# Patient Record
Sex: Male | Born: 1960 | Race: Black or African American | Hispanic: No | Marital: Single | Smoking: Never smoker
Health system: Southern US, Community
[De-identification: ages and names within clinical notes are randomized; demographics above are authoritative.]

## PROBLEM LIST (undated history)

## (undated) DIAGNOSIS — N189 Chronic kidney disease, unspecified: Secondary | ICD-10-CM

## (undated) DIAGNOSIS — F209 Schizophrenia, unspecified: Secondary | ICD-10-CM

## (undated) DIAGNOSIS — K219 Gastro-esophageal reflux disease without esophagitis: Secondary | ICD-10-CM

## (undated) DIAGNOSIS — S0990XA Unspecified injury of head, initial encounter: Secondary | ICD-10-CM

## (undated) HISTORY — PX: OTHER SURGICAL HISTORY: SHX169

## (undated) HISTORY — DX: Chronic kidney disease, unspecified: N18.9

---

## 2008-11-01 ENCOUNTER — Ambulatory Visit: Payer: Self-pay | Admitting: Internal Medicine

## 2013-11-23 DIAGNOSIS — N289 Disorder of kidney and ureter, unspecified: Secondary | ICD-10-CM | POA: Insufficient documentation

## 2013-11-23 DIAGNOSIS — F200 Paranoid schizophrenia: Secondary | ICD-10-CM | POA: Insufficient documentation

## 2013-11-23 DIAGNOSIS — S0990XA Unspecified injury of head, initial encounter: Secondary | ICD-10-CM | POA: Insufficient documentation

## 2013-11-23 DIAGNOSIS — F209 Schizophrenia, unspecified: Secondary | ICD-10-CM

## 2013-12-07 DIAGNOSIS — N182 Chronic kidney disease, stage 2 (mild): Secondary | ICD-10-CM | POA: Insufficient documentation

## 2013-12-07 DIAGNOSIS — N189 Chronic kidney disease, unspecified: Secondary | ICD-10-CM | POA: Insufficient documentation

## 2013-12-07 DIAGNOSIS — N289 Disorder of kidney and ureter, unspecified: Secondary | ICD-10-CM | POA: Insufficient documentation

## 2014-06-02 LAB — HM COLONOSCOPY

## 2014-06-07 DIAGNOSIS — K219 Gastro-esophageal reflux disease without esophagitis: Secondary | ICD-10-CM | POA: Insufficient documentation

## 2014-06-07 DIAGNOSIS — J309 Allergic rhinitis, unspecified: Secondary | ICD-10-CM | POA: Insufficient documentation

## 2015-01-19 DIAGNOSIS — F329 Major depressive disorder, single episode, unspecified: Secondary | ICD-10-CM | POA: Insufficient documentation

## 2015-01-19 DIAGNOSIS — F32A Depression, unspecified: Secondary | ICD-10-CM | POA: Insufficient documentation

## 2015-05-04 ENCOUNTER — Ambulatory Visit: Payer: Medicare PPO

## 2015-05-04 ENCOUNTER — Ambulatory Visit
Admission: EM | Admit: 2015-05-04 | Discharge: 2015-05-04 | Disposition: A | Discharge status: Home / Self Care | Payer: Medicare PPO | Attending: Internal Medicine | Admitting: Internal Medicine

## 2015-05-04 DIAGNOSIS — R05 Cough: Secondary | ICD-10-CM | POA: Diagnosis present

## 2015-05-04 DIAGNOSIS — Z87891 Personal history of nicotine dependence: Secondary | ICD-10-CM | POA: Insufficient documentation

## 2015-05-04 DIAGNOSIS — J018 Other acute sinusitis: Secondary | ICD-10-CM

## 2015-05-04 DIAGNOSIS — F209 Schizophrenia, unspecified: Secondary | ICD-10-CM | POA: Insufficient documentation

## 2015-05-04 DIAGNOSIS — J019 Acute sinusitis, unspecified: Secondary | ICD-10-CM | POA: Diagnosis not present

## 2015-05-04 DIAGNOSIS — J40 Bronchitis, not specified as acute or chronic: Secondary | ICD-10-CM | POA: Diagnosis not present

## 2015-05-04 DIAGNOSIS — K219 Gastro-esophageal reflux disease without esophagitis: Secondary | ICD-10-CM | POA: Insufficient documentation

## 2015-05-04 HISTORY — DX: Schizophrenia, unspecified: F20.9

## 2015-05-04 HISTORY — DX: Unspecified injury of head, initial encounter: S09.90XA

## 2015-05-04 HISTORY — DX: Gastro-esophageal reflux disease without esophagitis: K21.9

## 2015-05-04 MED ORDER — PREDNISONE 50 MG PO TABS: 50.0000 mg | ORAL_TABLET | Freq: Every day | ORAL | Status: DC

## 2015-05-04 MED ORDER — AZITHROMYCIN 250 MG PO TABS: 250.0000 mg | ORAL_TABLET | Freq: Every day | ORAL | Status: DC

## 2015-05-04 NOTE — ED Notes (Signed)
Started around July 1st with cough and runny nose. Denies fever. States occasionally coughs yellow sputum. Brought in by Mom. Hx of schizophrenia

## 2015-05-04 NOTE — Discharge Instructions (Signed)
Prescriptions for zithromax and prednisone were sent to [facility] in [facility]. Recheck or followup with primary care provider if not improving in a few days, or for new fever >100.5, increasing phlegm production.
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
## 2015-10-13 IMAGING — CR DG CHEST 2V
2 series · 2 of 2 positions shown · non-contrast
Comparison: None.

CLINICAL DATA: Dry cough for the past 3 weeks.  Ex-smoker.

EXAM:
CHEST  2 VIEW

[chest pa]
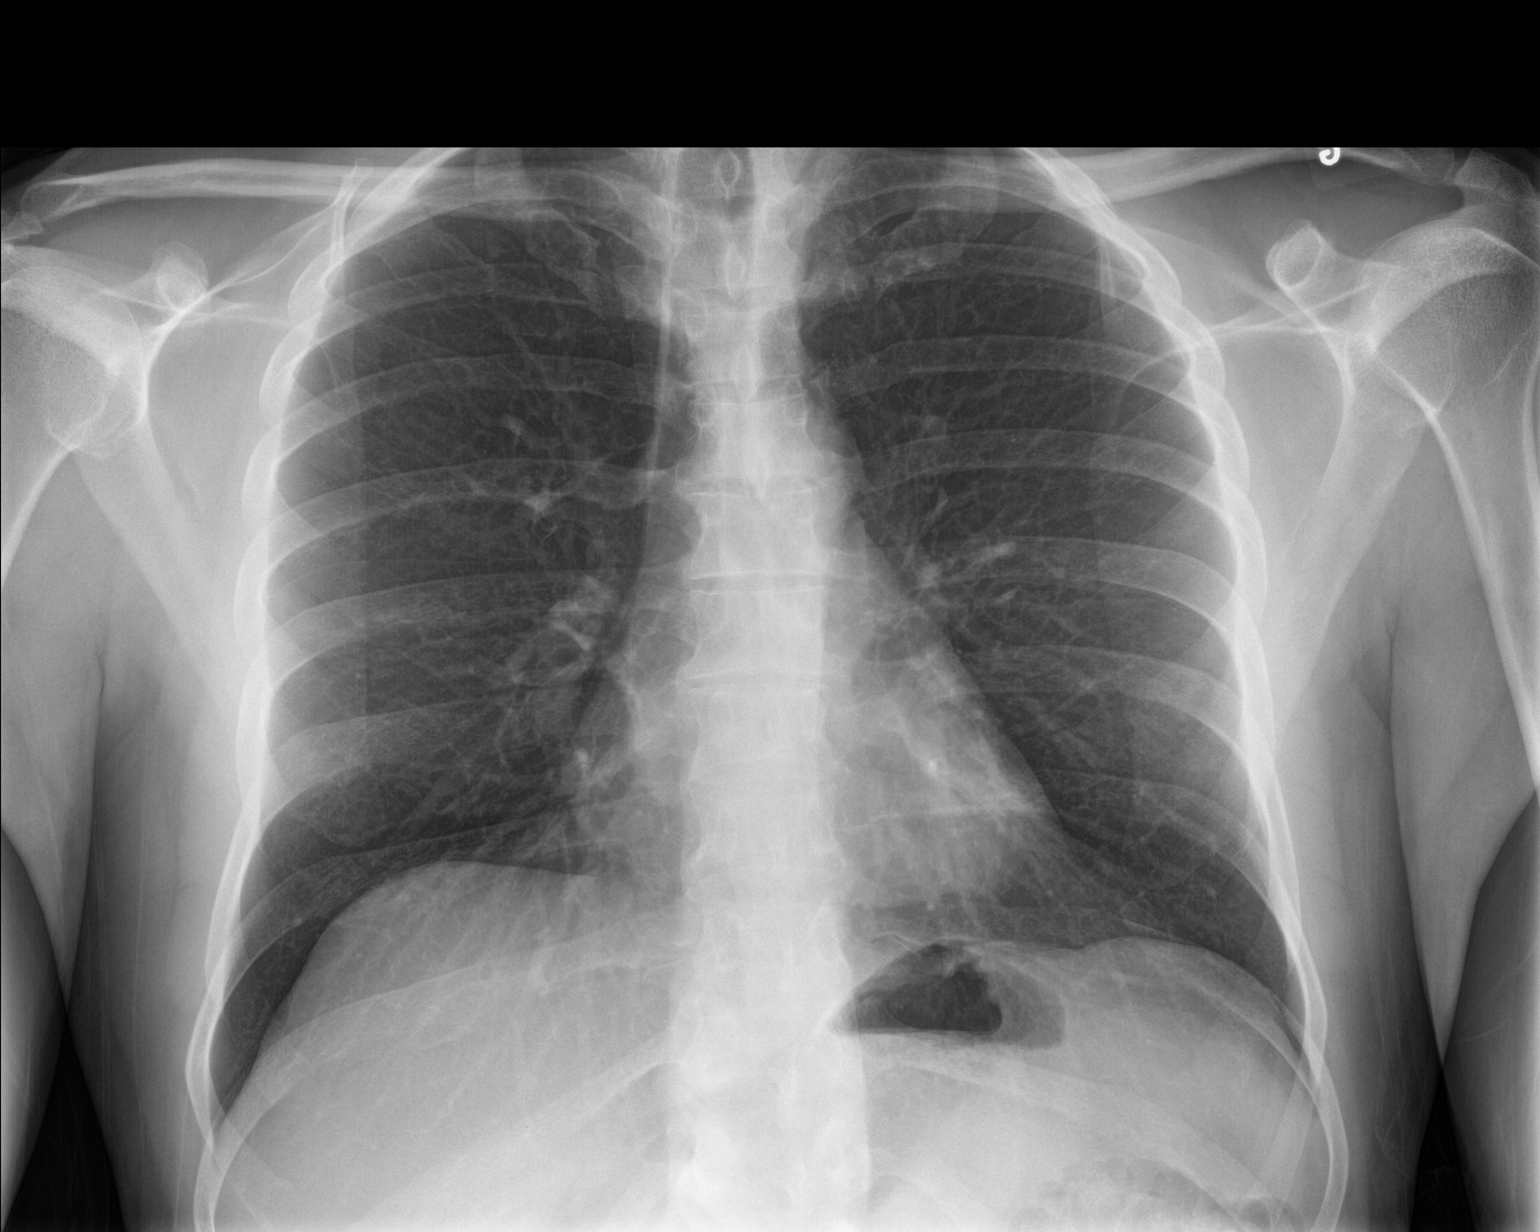

[chest lat]
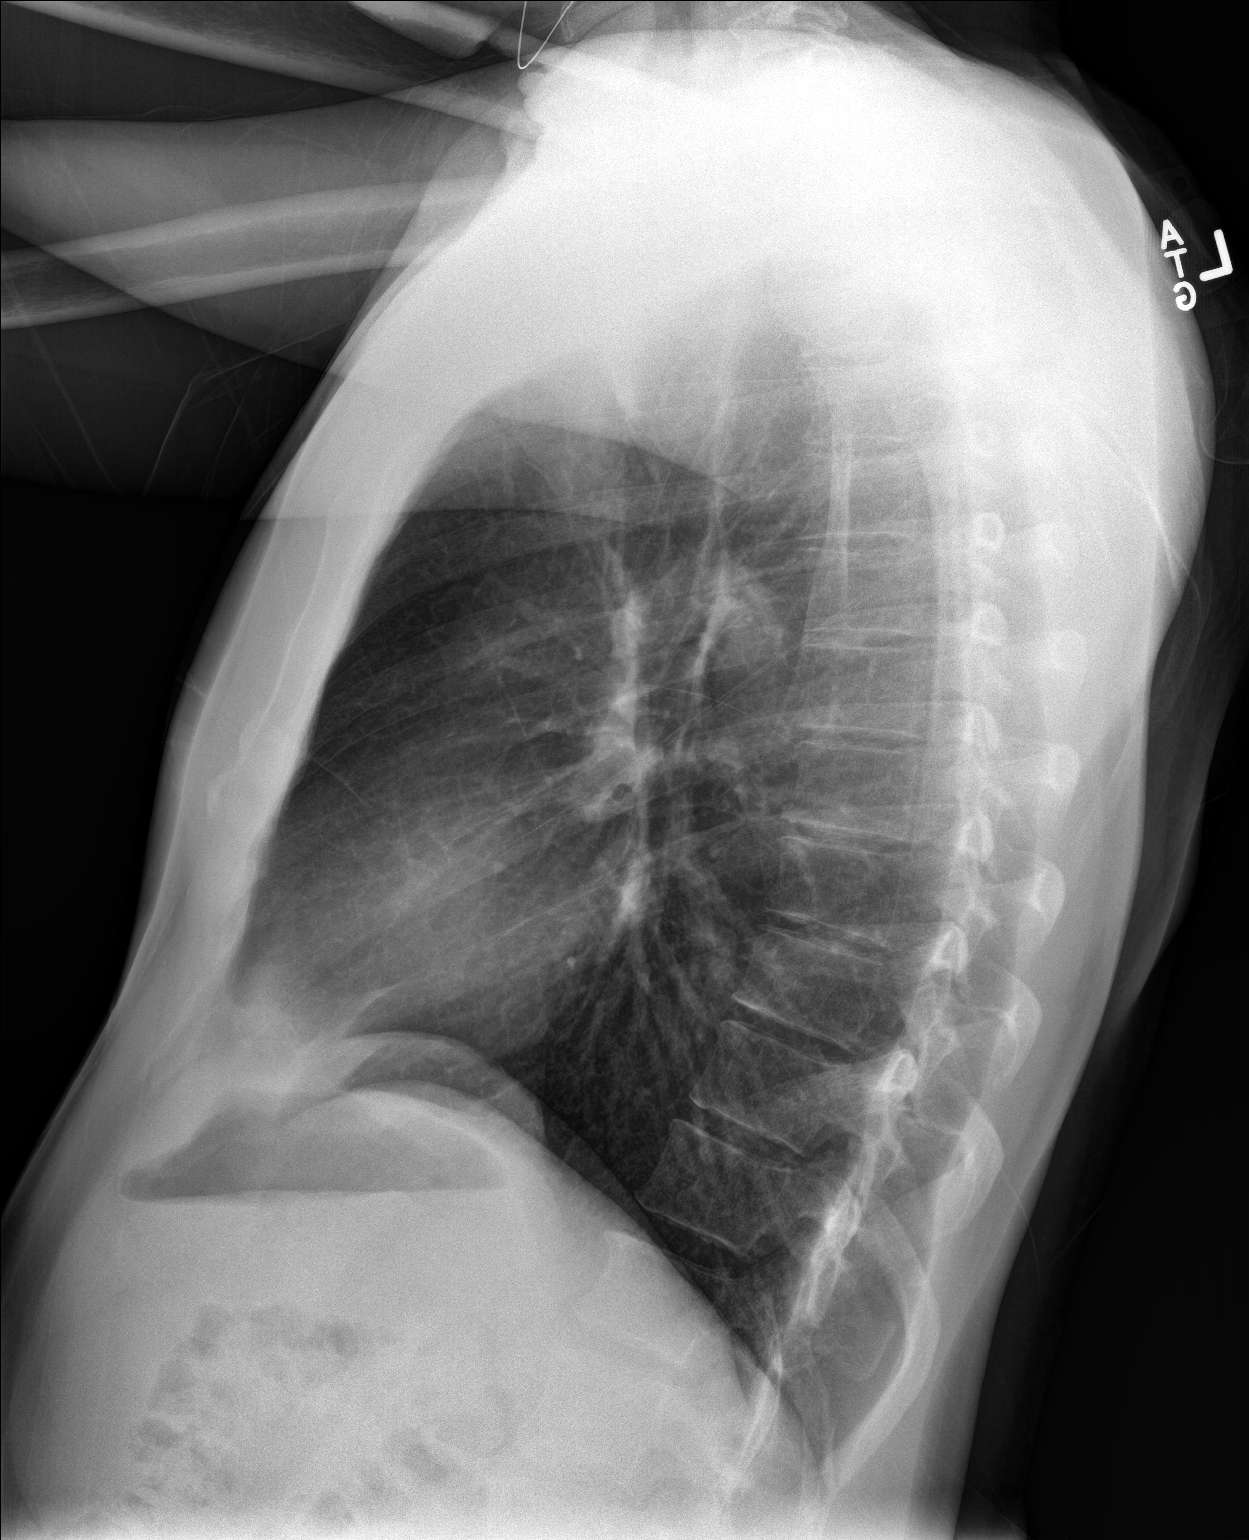

[2 of 2 positions shown; findings below may reference images not displayed]

FINDINGS: Normal sized heart. Clear lungs. Minimal thoracic spine degenerative
changes.
IMPRESSION: No acute abnormality.

## 2016-08-23 DIAGNOSIS — D696 Thrombocytopenia, unspecified: Secondary | ICD-10-CM | POA: Insufficient documentation

## 2016-08-23 DIAGNOSIS — R69 Illness, unspecified: Secondary | ICD-10-CM | POA: Insufficient documentation

## 2017-07-31 ENCOUNTER — Encounter: Payer: Self-pay | Admitting: *Deleted

## 2017-07-31 ENCOUNTER — Ambulatory Visit
Admission: EM | Admit: 2017-07-31 | Discharge: 2017-07-31 | Disposition: A | Discharge status: Home / Self Care | Payer: Medicare Other | Attending: Family Medicine | Admitting: Family Medicine

## 2017-07-31 DIAGNOSIS — M25522 Pain in left elbow: Secondary | ICD-10-CM

## 2017-07-31 DIAGNOSIS — M25512 Pain in left shoulder: Secondary | ICD-10-CM

## 2017-07-31 MED ORDER — PREDNISONE 10 MG (21) PO TBPK: ORAL_TABLET | Freq: Every day | ORAL | 0 refills | Status: DC

## 2017-07-31 NOTE — ED Triage Notes (Signed)
Patient started having left shoulder pain and left arm pain 2 weeks ago when he ran into a door jam.
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
## 2017-07-31 NOTE — Discharge Instructions (Signed)
Prednisone as prescribed.  Follow up with your primary.  Take care  Dr. Adriana Simasook

## 2017-08-18 ENCOUNTER — Ambulatory Visit (INDEPENDENT_AMBULATORY_CARE_PROVIDER_SITE_OTHER): Payer: Medicare Other | Admitting: Psychiatry

## 2017-08-18 ENCOUNTER — Encounter: Payer: Self-pay | Admitting: Psychiatry

## 2017-08-18 ENCOUNTER — Other Ambulatory Visit: Payer: Self-pay

## 2017-08-18 VITALS — BP 124/77 | HR 83 | Temp 97.5°F | Wt 158.6 lb

## 2017-08-18 DIAGNOSIS — F200 Paranoid schizophrenia: Secondary | ICD-10-CM

## 2017-08-18 MED ORDER — ARIPIPRAZOLE 30 MG PO TABS: 30.0000 mg | ORAL_TABLET | Freq: Every day | ORAL | 2 refills | Status: DC

## 2017-08-18 NOTE — Patient Instructions (Signed)
PLEASE GET YOUR LABS ORDERED - TSH, LIPID PANEL, HBA1C, PROLACTIN  PLEASE GET EKG .
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
## 2017-10-20 ENCOUNTER — Other Ambulatory Visit: Payer: Self-pay

## 2017-10-20 ENCOUNTER — Ambulatory Visit (INDEPENDENT_AMBULATORY_CARE_PROVIDER_SITE_OTHER): Payer: Medicare Other | Admitting: Psychiatry

## 2017-10-20 ENCOUNTER — Encounter: Payer: Self-pay | Admitting: Psychiatry

## 2017-10-20 VITALS — BP 120/70 | HR 82 | Temp 98.7°F | Wt 159.2 lb

## 2017-10-20 DIAGNOSIS — F200 Paranoid schizophrenia: Secondary | ICD-10-CM

## 2017-10-20 MED ORDER — ARIPIPRAZOLE 30 MG PO TABS: 30.0000 mg | ORAL_TABLET | Freq: Every day | ORAL | 1 refills | Status: DC

## 2017-10-20 NOTE — Progress Notes (Signed)
BH MD OP Progress Note  10/20/2017 11:55 AM Randall Simon  MRN:  [id number]  Chief Complaint: ' I am ok."  Chief Complaint    Follow-up; Medication Refill     HPI: Randall Simon is a 57 year old African-American male, single, on SSD, lives with his mother in [place], has a history of schizophrenia, presented to the clinic Simon for a follow-up visit.  Randall Simon and his mother Randall Simon both participated in the evaluation Simon.  Randall Simon was seen initially in person and later on his mother also participated.  Randall Simon reports that he is doing well on his current medication Abilify.  He denies any perceptual disturbances or mood symptoms at this time.  He denies any suicidality or homicidality.  He reports sleep and appetite as okay.  He denies any side effects to the medications.  Aims screening done equals 0.  Per mother he is currently doing well.  He does not appear to be paranoid or labile at home.  He has been doing chores around the house.  And he is observed as sleeping well.  Per mother there is no concern for any substance abuse problems.   Visit Diagnosis:    ICD-10-CM   1. Paranoid schizophrenia (HCC) F20.0 ARIPiprazole (ABILIFY) 30 MG tablet    Past Psychiatric History: Schizophrenia diagnosed in 10.  Previous admission to [facility].  Used to follow-up with Randall Simon in the past.  Currently on Abilify.  Denies past history of suicide attempts.  Past Medical History:  Past Medical History:  Diagnosis Date  . Chronic kidney disease   . GERD (gastroesophageal reflux disease)   . Head injury   . Schizophrenia Randall Simon)     Past Surgical History:  Procedure Laterality Date  . head surgery      Family Psychiatric History: Mother attempted suicide in the past.  Reports she was going through marital stressors at that time.  Family History:  Family History  Problem Relation Age of Onset  . Suicidality Mother   . Heart attack Father     Social  History: Patient is single.  He graduated high school and did one year of art school.  He is currently on SSD.  He lives with his mother in [place].  He has a twin brother and 2 other siblings.  He reports he has a good relationship with them. Social History   Socioeconomic History  . Marital status: Single    Spouse name: None  . Number of children: 0  . Years of education: None  . Highest education level: None  Social Needs  . Financial resource strain: Not very hard  . Food insecurity - worry: Never true  . Food insecurity - inability: Never true  . Transportation needs - medical: No  . Transportation needs - non-medical: No  Occupational History  . Occupation: disabiled  Tobacco Use  . Smoking status: Former Games developer  . Smokeless tobacco: Never Used  Substance and Sexual Activity  . Alcohol use: No  . Drug use: No  . Sexual activity: Not Currently  Other Topics Concern  . None  Social History Narrative  . None    Allergies: No Known Allergies  Metabolic Disorder Labs: No results found for: HGBA1C, MPG No results found for: PROLACTIN No results found for: CHOL, TRIG, HDL, CHOLHDL, VLDL, LDLCALC No results found for: TSH  Therapeutic Level Labs: No results found for: LITHIUM No results found for: VALPROATE No components found for:  CBMZ  Current  Medications: Current Outpatient Medications  Medication Sig Dispense Refill  . ARIPiprazole (ABILIFY) 30 MG tablet Take 1 tablet (30 mg total) by mouth daily. 90 tablet 1  . famotidine (PEPCID) 40 MG tablet Take 40 mg by mouth daily.    Marland Kitchen. acetaminophen (TYLENOL) 500 MG tablet Take 500 mg by mouth.    Marland Kitchen. FLUCELVAX QUADRIVALENT 0.5 ML SUSY TO BE ADMINISTERED BY PHARMACIST FOR IMMUNIZATION  0  . predniSONE (STERAPRED UNI-PAK 21 TAB) 10 MG (21) TBPK tablet Take by mouth daily. 6 tablets on Day 1, then decrease by 1 tablet daily until gone. (Patient not taking: Reported on 10/20/2017) 21 tablet 0  . vitamin B-12 (CYANOCOBALAMIN) 1000  MCG tablet Take by mouth.     No current facility-administered medications for this visit.      Musculoskeletal: Strength & Muscle Tone: within normal limits Gait & Station: normal Patient leans: N/A  Psychiatric Specialty Exam: Review of Systems  Psychiatric/Behavioral: Negative for depression and suicidal ideas. The patient is not nervous/anxious and does not have insomnia.   All other systems reviewed and are negative.   Blood pressure 120/70, pulse 82, temperature 98.7 F (37.1 C), temperature source Oral, weight 159 lb 3.2 oz (72.2 kg).Body mass index is 22.84 kg/m.  General Appearance: Casual  Eye Contact:  Fair  Speech:  Clear and Coherent  Volume:  Normal  Mood:  Euthymic  Affect:  Constricted  Thought Process:  Goal Directed and Descriptions of Associations: Intact  Orientation:  Full (Time, Place, and Person)  Thought Content: Logical   Suicidal Thoughts:  No  Homicidal Thoughts:  No  Memory:  Immediate;   Fair Recent;   Fair Remote;   Fair  Judgement:  Fair  Insight:  Fair  Psychomotor Activity:  Normal  Concentration:  Concentration: Fair and Attention Span: Fair  Recall:  [employer]  Fund of Knowledge: Fair  Language: Fair  Akathisia:  No  Handed:  Right  AIMS (if indicated): 0  Assets:  Communication Skills Desire for Improvement Social Support  ADL's:  Intact  Cognition: WNL  Sleep:  Fair   Screenings:AIMS    Assessment and Plan: Randall HillDonald is a 57 year old African-American male who has a history of schizophrenia.  He presents Simon with his mother.  He continues to do well on his Abilify and is compliant on his medication.  Denies any side effects.  He denies suicidality or perceptual disturbances.  He continues to be a good candidate for outpatient treatment at this time.  Plan as noted below.  Plan  For schizophrenia Continue Abilify 30 mg p.o. daily Aims =0  Pending EKG for QTC monitoring.  Reviewed medical records from Dr.  Jenelle Simon.  Collateral  information was obtained from mother.  Follow-up in 3 months or sooner if needed.  More than 50 % of the time was spent for psychoeducation and supportive psychotherapy and care coordination.  This note was generated in part or whole with voice recognition software. Voice recognition is usually quite accurate but there are transcription errors that can and very often do occur. I apologize for any typographical errors that were not detected and corrected.       Jomarie LongsSaramma Morgin Halls, MD 10/20/2017, 11:55 AM

## 2017-10-27 ENCOUNTER — Telehealth: Payer: Self-pay | Admitting: Psychiatry

## 2017-10-27 NOTE — Telephone Encounter (Signed)
EKG as well as labs results received from Dr. Sharilyn SitesMark Heffington. EKG reviewed-QTC within normal limits-normal ECG.  Dated 08/28/2017. TSH-08/28/2017-within normal limits Prolactin-08/28/2017-4.6-within normal limits Lipid panel-08/28/2017-HDL 37 slightly low, LDL-120-within normal limits CBC with differential- WBC low at 3.8-platelet low at 132-appears to be chronic.

## 2018-01-12 ENCOUNTER — Ambulatory Visit
Admission: EM | Admit: 2018-01-12 | Discharge: 2018-01-12 | Disposition: A | Discharge status: Home / Self Care | Payer: Medicare Other | Attending: Family Medicine | Admitting: Family Medicine

## 2018-01-12 ENCOUNTER — Other Ambulatory Visit: Payer: Self-pay

## 2018-01-12 ENCOUNTER — Encounter: Payer: Self-pay | Admitting: Emergency Medicine

## 2018-01-12 DIAGNOSIS — R05 Cough: Secondary | ICD-10-CM

## 2018-01-12 DIAGNOSIS — R058 Other specified cough: Secondary | ICD-10-CM

## 2018-01-12 MED ORDER — PREDNISONE 20 MG PO TABS: 40.0000 mg | ORAL_TABLET | Freq: Every day | ORAL | 0 refills | Status: DC

## 2018-01-12 MED ORDER — BENZONATATE 100 MG PO CAPS: 100.0000 mg | ORAL_CAPSULE | Freq: Three times a day (TID) | ORAL | 0 refills | Status: DC | PRN

## 2018-01-12 MED ORDER — ALBUTEROL SULFATE HFA 108 (90 BASE) MCG/ACT IN AERS: 2.0000 | INHALATION_SPRAY | Freq: Four times a day (QID) | RESPIRATORY_TRACT | 0 refills | Status: DC | PRN

## 2018-01-12 MED ORDER — DOXYCYCLINE HYCLATE 100 MG PO CAPS: 100.0000 mg | ORAL_CAPSULE | Freq: Two times a day (BID) | ORAL | 0 refills | Status: DC

## 2018-01-12 NOTE — ED Provider Notes (Signed)
[facility] ____________________________________________  Time seen: Approximately 3:00 PM  I have reviewed the triage vital signs and the nursing notes.   HISTORY  Chief Complaint Cough   HPI Randall Simon is a 57 y.o. male presenting for evaluation of cough and congestion symptoms that is been present for the last 3 weeks.  Patient reports he has had a cough lingering, states maybe slightly improved but continues.  States also with accompanying nasal congestion and drainage.  Denies sinus pain or sinus pressure.  Denies sore throat.  Denies accompanying fevers.  States unrelieved with some over-the-counter medications given from his mom.  Denies hemoptysis, chest pain, shortness of breath, known fevers or extremity pain.  He has continue to remain active.  Denies known seasonal allergy issues.  States his mom is sick with similar complaints.  Denies other known sick contacts.  Reports otherwise feels well. Denies recent sickness. Denies recent antibiotic use.   Sharilyn SitesHeffington, Mark, MD: PCP   Past Medical History:  Diagnosis Date  . Chronic kidney disease   . GERD (gastroesophageal reflux disease)   . Head injury   . Schizophrenia [facility](HCC)     Patient Active Problem List   Diagnosis Date Noted  . Taking medication for chronic disease 08/23/2016  . Thrombocytopenia (HCC) 08/23/2016  . Depression 01/19/2015  . Allergic rhinitis 06/07/2014  . GERD (gastroesophageal reflux disease) 06/07/2014  . Renal insufficiency, mild 12/07/2013  . Head injury 11/23/2013  . Kidney disease 11/23/2013  . Schizophrenia (HCC) 11/23/2013    Past Surgical History:  Procedure Laterality Date  . head surgery       No current facility-administered medications for this encounter.   Current Outpatient Medications:  .  ARIPiprazole (ABILIFY) 30 MG tablet, Take 1 tablet (30 mg total) by mouth daily., Disp: 90 tablet, Rfl: 1 .  famotidine (PEPCID) 40 MG tablet, Take 40 mg by mouth  daily., Disp: , Rfl:  .  vitamin B-12 (CYANOCOBALAMIN) 1000 MCG tablet, Take by mouth., Disp: , Rfl:  .  albuterol (PROVENTIL HFA;VENTOLIN HFA) 108 (90 Base) MCG/ACT inhaler, Inhale 2 puffs into the lungs every 6 (six) hours as needed for wheezing., Disp: 1 Inhaler, Rfl: 0 .  benzonatate (TESSALON PERLES) 100 MG capsule, Take 1 capsule (100 mg total) by mouth 3 (three) times daily as needed for cough., Disp: 15 capsule, Rfl: 0 .  doxycycline (VIBRAMYCIN) 100 MG capsule, Take 1 capsule (100 mg total) by mouth 2 (two) times daily., Disp: 20 capsule, Rfl: 0 .  FLUCELVAX QUADRIVALENT 0.5 ML SUSY, TO BE ADMINISTERED BY PHARMACIST FOR IMMUNIZATION, Disp: , Rfl: 0 .  predniSONE (DELTASONE) 20 MG tablet, Take 2 tablets (40 mg total) by mouth daily., Disp: 10 tablet, Rfl: 0  Allergies Patient has no known allergies.  Family History  Problem Relation Age of Onset  . Suicidality Mother   . Heart attack Father     Social History Social History   Tobacco Use  . Smoking status: Former Games developermoker  . Smokeless tobacco: Never Used  Substance Use Topics  . Alcohol use: No  . Drug use: No    Review of Systems Constitutional: No fever/chills ENT: No sore throat. As above.  Cardiovascular: Denies chest pain. Respiratory: Denies shortness of breath. Gastrointestinal: No abdominal pain.  No nausea, no vomiting.  No diarrhea. Genitourinary: Negative for dysuria. Musculoskeletal: Negative for back pain. Skin: Negative for rash. ____________________________________________   PHYSICAL EXAM:  VITAL SIGNS: ED Triage Vitals  Enc Vitals Group  BP 01/12/18 1409 126/68     Pulse Rate 01/12/18 1409 85     Resp 01/12/18 1409 16     Temp 01/12/18 1409 98.7 F (37.1 C)     Temp Source 01/12/18 1409 Oral     SpO2 01/12/18 1409 100 %     Weight 01/12/18 1409 159 lb (72.1 kg)     Height 01/12/18 1409 5\' 10"  (1.778 m)     Head Circumference --      Peak Flow --      Pain Score 01/12/18 1408 0     Pain  Loc --      Pain Edu? --      Excl. in GC? --     Constitutional: Alert and oriented. Well appearing and in no acute distress. Eyes: Conjunctivae are normal.  Head: Atraumatic. No sinus tenderness to palpation. No swelling. No erythema.  Ears: no erythema, normal TMs bilaterally.   Nose:Nasal congestion  Mouth/Throat: Mucous membranes are moist. No pharyngeal erythema. No tonsillar swelling or exudate.  Neck: No stridor.  No cervical spine tenderness to palpation. Hematological/Lymphatic/Immunilogical: No cervical lymphadenopathy. Cardiovascular: Normal rate, regular rhythm. Grossly normal heart sounds.  Good peripheral circulation. Respiratory: Normal respiratory effort.  No retractions. No wheezes.  Mild scattered rhonchi.  Dry intermittent cough in room with bronchospasm noted. Good air movement.  Speaks in complete sentences. Musculoskeletal: Ambulatory with steady gait. No cervical, thoracic or lumbar tenderness to palpation. Neurologic:  Normal speech and language. No gait instability. Skin:  Skin appears warm, dry and intact. No rash noted. Psychiatric: Mood and affect are normal. Speech and behavior are normal.  ___________________________________________   LABS (all labs ordered are listed, but only abnormal results are displayed)  Labs Reviewed - No data to display   PROCEDURES Procedures    INITIAL IMPRESSION / ASSESSMENT AND PLAN / ED COURSE  Pertinent labs & imaging results that were available during my care of the patient were reviewed by me and considered in my medical decision making (see chart for details).  Well-appearing patient.  No acute distress.  Suspect recent viral upper respiratory infection versus allergic rhinitis initially symptoms.  Will treat with oral doxycycline, prednisone, Tessalon Perles and PRN albuterol.  Encourage rest, fluids, supportive care.Discussed indication, risks and benefits of medications with patient.  Discussed follow up with  Primary care physician this week. Discussed follow up and return parameters including no resolution or any worsening concerns. Patient verbalized understanding and agreed to plan.   ____________________________________________   FINAL CLINICAL IMPRESSION(S) / ED DIAGNOSES  Final diagnoses:  Cough present for greater than 3 weeks     ED Discharge Orders        Ordered    benzonatate (TESSALON PERLES) 100 MG capsule  3 times daily PRN     01/12/18 1451    doxycycline (VIBRAMYCIN) 100 MG capsule  2 times daily     01/12/18 1451    albuterol (PROVENTIL HFA;VENTOLIN HFA) 108 (90 Base) MCG/ACT inhaler  Every 6 hours PRN     01/12/18 1451    predniSONE (DELTASONE) 20 MG tablet  Daily     01/12/18 1451       Note: This dictation was prepared with Dragon dictation along with smaller phrase technology. Any transcriptional errors that result from this process are unintentional.         Renford Dills, NP 01/12/18 1527

## 2018-01-12 NOTE — ED Triage Notes (Signed)
Patient in today c/o 3 week history of cough. Patient denies fever.

## 2018-01-12 NOTE — Discharge Instructions (Addendum)
Take medication as prescribed. Rest. Drink plenty of fluids.  ° °Follow up with your primary care physician this week as needed. Return to Urgent care for new or worsening concerns.  ° °

## 2018-01-15 ENCOUNTER — Telehealth: Payer: Self-pay

## 2018-01-15 ENCOUNTER — Ambulatory Visit: Payer: Medicare Other | Admitting: Psychiatry

## 2018-01-15 NOTE — Telephone Encounter (Signed)
Please let patient know he was given 90 days of abilify with refill , which is good until he comes back.

## 2018-01-15 NOTE — Telephone Encounter (Signed)
pt mother called left message that they had to cancel appt because everyone is sick.  pt had appt for today but had to cancel .  pt was last seen on  10-20-17. pt mother asked if a refill can be called in until everyone feels better they will r/s appt.

## 2018-01-15 NOTE — Telephone Encounter (Signed)
Patient does have one remaining refill on abilify.

## 2018-01-29 ENCOUNTER — Other Ambulatory Visit: Payer: Self-pay

## 2018-01-29 ENCOUNTER — Ambulatory Visit (INDEPENDENT_AMBULATORY_CARE_PROVIDER_SITE_OTHER): Payer: Medicare Other | Admitting: Psychiatry

## 2018-01-29 ENCOUNTER — Encounter: Payer: Self-pay | Admitting: Psychiatry

## 2018-01-29 VITALS — BP 119/75 | HR 75 | Temp 98.3°F | Wt 157.6 lb

## 2018-01-29 DIAGNOSIS — F200 Paranoid schizophrenia: Secondary | ICD-10-CM | POA: Diagnosis not present

## 2018-01-29 NOTE — Progress Notes (Signed)
[facility] MD OP Progress Note  01/29/2018 3:58 PM Randall Simon  MRN:  [id number]  Chief Complaint: ' I am here for follow up.' Chief Complaint    Follow-up; Medication Refill     HPI: Randall Simon is a 57 year old African-American male, single, on SSD, lives with his mother in [place], has a history of schizophrenia, presented to the clinic today for a follow-up visit.  Patient today presented along with his mother Ms. Randall Simon.  Patient and his mother both participated in the evaluation today.  Patient reports he is currently tolerating the Abilify well.  He continues to be on 30 mg daily.  He denies any side effects.  He denies any recent changes in his mood.  He denies any sadness or anxiety symptoms.  He reports sleep and appetite is okay.  An abnormal involuntary movement scale was done today and he scored 0 on the same.  Discussed with patient and mother that if he continues to do well on this dose then his dose can be slowly tapered down to a lower dose in 6 months or so.  Patient has been on this dose since the past 7 years or so.   Visit Diagnosis:    ICD-10-CM   1. Paranoid schizophrenia (HCC) F20.0     Past Psychiatric History: I have reviewed past psychiatric history from my progress note on 10/20/2017  Past Medical History:  Past Medical History:  Diagnosis Date  . Chronic kidney disease   . GERD (gastroesophageal reflux disease)   . Head injury   . Schizophrenia [facility](HCC)     Past Surgical History:  Procedure Laterality Date  . head surgery      Family Psychiatric History: I have reviewed family psychiatric history from my progress note on 10/20/2017.  Family History:  Family History  Problem Relation Age of Onset  . Suicidality Mother   . Heart attack Father     Social History: Patient is single.  He graduated high school.  He did one year of art school.  He is currently on SSD.  He lives with his mother in [place].  He has a twin brother and 2 other siblings.  He  reports he has a good relationship with them. Social History   Socioeconomic History  . Marital status: Single    Spouse name: Not on file  . Number of children: 0  . Years of education: Not on file  . Highest education level: Not on file  Occupational History  . Occupation: disabiled  Social Needs  . Financial resource strain: Not very hard  . Food insecurity:    Worry: Never true    Inability: Never true  . Transportation needs:    Medical: No    Non-medical: No  Tobacco Use  . Smoking status: Former Games developermoker  . Smokeless tobacco: Never Used  Substance and Sexual Activity  . Alcohol use: No  . Drug use: No  . Sexual activity: Not Currently  Lifestyle  . Physical activity:    Days per week: Not on file    Minutes per session: Not on file  . Stress: Not at all  Relationships  . Social connections:    Talks on phone: Once a week    Gets together: Once a week    Attends religious service: Patient refused    Active member of club or organization: No    Attends meetings of clubs or organizations: Never    Relationship status: Never married  Other Topics  Concern  . Not on file  Social History Narrative  . Not on file    Allergies: No Known Allergies  Metabolic Disorder Labs: No results found for: HGBA1C, MPG No results found for: PROLACTIN No results found for: CHOL, TRIG, HDL, CHOLHDL, VLDL, LDLCALC No results found for: TSH  Therapeutic Level Labs: No results found for: LITHIUM No results found for: VALPROATE No components found for:  CBMZ  Current Medications: Current Outpatient Medications  Medication Sig Dispense Refill  . albuterol (PROVENTIL HFA;VENTOLIN HFA) 108 (90 Base) MCG/ACT inhaler Inhale 2 puffs into the lungs every 6 (six) hours as needed for wheezing. 1 Inhaler 0  . ARIPiprazole (ABILIFY) 30 MG tablet Take 1 tablet (30 mg total) by mouth daily. 90 tablet 1  . benzonatate (TESSALON PERLES) 100 MG capsule Take 1 capsule (100 mg total) by mouth 3  (three) times daily as needed for cough. 15 capsule 0  . doxycycline (VIBRAMYCIN) 100 MG capsule Take 1 capsule (100 mg total) by mouth 2 (two) times daily. 20 capsule 0  . famotidine (PEPCID) 40 MG tablet Take 40 mg by mouth daily.    Marland Kitchen FLUCELVAX QUADRIVALENT 0.5 ML SUSY TO BE ADMINISTERED BY PHARMACIST FOR IMMUNIZATION  0  . predniSONE (DELTASONE) 20 MG tablet Take 2 tablets (40 mg total) by mouth daily. 10 tablet 0  . vitamin B-12 (CYANOCOBALAMIN) 1000 MCG tablet Take by mouth.     No current facility-administered medications for this visit.      Musculoskeletal: Strength & Muscle Tone: within normal limits Gait & Station: normal Patient leans: N/A  Psychiatric Specialty Exam: Review of Systems  Psychiatric/Behavioral: Negative for hallucinations.  All other systems reviewed and are negative.   Blood pressure 119/75, pulse 75, temperature 98.3 F (36.8 C), temperature source Oral, weight 157 lb 9.6 oz (71.5 kg).Body mass index is 22.61 kg/m.  General Appearance: Casual  Eye Contact:  Fair  Speech:  Clear and Coherent  Volume:  Normal  Mood:  Euthymic  Affect:  Congruent  Thought Process:  Goal Directed and Descriptions of Associations: Intact  Orientation:  Full (Time, Place, and Person)  Thought Content: Logical   Suicidal Thoughts:  No  Homicidal Thoughts:  No  Memory:  Immediate;   Fair Recent;   Fair Remote;   Fair  Judgement:  Fair  Insight:  Fair  Psychomotor Activity:  Normal  Concentration:  Concentration: Fair and Attention Span: Fair  Recall:  [employer] of Knowledge: Fair  Language: Fair  Akathisia:  No  Handed:  Right  AIMS (if indicated): 0  Assets:  Communication Skills Desire for Improvement Housing Social Support  ADL's:  Intact  Cognition: WNL  Sleep:  Fair   Screenings: AIMS     [facility] Visit from [facility]/29/2018 in [facility]  AIMS Total Score  0    AIMS   Assessment and Plan: Randall Simon is a 57 year old  African-American male who has a history of schizophrenia.  He today presented along with his mother for the evaluation.  He and his mother both participated in the evaluation today.  He continues to do well.  He continues to be compliant on medication as prescribed.  Plan as noted below.  Plan For schizophrenia Continue Abilify 30 mg p.o. daily Aims equals 0  Discussed with patient as well as mother that we have received his most recent blood work as well as EKG from his previous primary medical doctor.  He does have a history of low  platelet count.  His PMD will continue to manage the same.  Follow-up in clinic in 5 months or sooner if needed.  More than 50 % of the time was spent for psychoeducation and supportive psychotherapy and care coordination.  This note was generated in part or whole with voice recognition software. Voice recognition is usually quite accurate but there are transcription errors that can and very often do occur. I apologize for any typographical errors that were not detected and corrected.       Jomarie Longs, MD 01/29/2018, 3:58 PM

## 2018-04-29 ENCOUNTER — Other Ambulatory Visit: Payer: Self-pay | Admitting: Child and Adolescent Psychiatry

## 2018-04-29 DIAGNOSIS — F200 Paranoid schizophrenia: Secondary | ICD-10-CM

## 2018-04-29 MED ORDER — ARIPIPRAZOLE 30 MG PO TABS: 30.0000 mg | ORAL_TABLET | Freq: Every day | ORAL | 0 refills | Status: DC

## 2018-04-29 NOTE — Progress Notes (Signed)
Covering physician note:  Refills requested by pt's mother over the phone. Chart was reviewed. Pt was last seen Dr. Elna BreslowEappen on 04/18 with plan to continue Abilify 30 mg daily and follow up in 5 months. Abilify 30 mg rx was last sent to [facility] on 10/20/2017 with 90 tabs x 1 refill. [facility] to [facility]. Front desk will let pt's mother know about this.

## 2018-06-23 ENCOUNTER — Encounter: Payer: Self-pay | Admitting: Psychiatry

## 2018-06-23 ENCOUNTER — Ambulatory Visit (INDEPENDENT_AMBULATORY_CARE_PROVIDER_SITE_OTHER): Payer: Medicare Other | Admitting: Psychiatry

## 2018-06-23 ENCOUNTER — Other Ambulatory Visit: Payer: Self-pay

## 2018-06-23 VITALS — BP 116/74 | HR 73 | Temp 97.7°F | Wt 160.6 lb

## 2018-06-23 DIAGNOSIS — F200 Paranoid schizophrenia: Secondary | ICD-10-CM

## 2018-06-23 MED ORDER — ARIPIPRAZOLE 30 MG PO TABS: 30.0000 mg | ORAL_TABLET | Freq: Every day | ORAL | 1 refills | Status: DC

## 2018-06-23 NOTE — Progress Notes (Signed)
[facility] MD OP Progress Note  06/23/2018 4:45 PM KENSHIN SPLAWN  MRN:  [id number]  Chief Complaint: ' I am here for follow up." Chief Complaint    Follow-up; Medication Refill     HPI: Randall Simon is a 57 year old African-American male, single, on SSD, lives with his mother in [place], has a history of schizophrenia, presented to the clinic today for a follow-up visit.  Patient today reports he is currently doing well.  He denies any mood symptoms.  He reports sleep is fair.  He continues to be compliant with his 30 mg of Abilify.  He denies any side effects.  He denies any stiffness, rigidity or tremors.  He continues to have good social support from his mother.  He denies any other concerns today. Visit Diagnosis:    ICD-10-CM   1. 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  Past Medical History:  Past Medical History:  Diagnosis Date  . Chronic kidney disease   . GERD (gastroesophageal reflux disease)   . Head injury   . Schizophrenia [facility])     Past Surgical History:  Procedure Laterality Date  . head surgery      Family Psychiatric History: Reviewed family psychiatric history from my progress note on 10/20/2017.  Family History:  Family History  Problem Relation Age of Onset  . Suicidality Mother   . Heart attack Father     Social History: Reviewed social history from my progress note on 10/20/2017 Social History   Socioeconomic History  . Marital status: Single    Spouse name: Not on file  . Number of children: 0  . Years of education: Not on file  . Highest education level: Not on file  Occupational History  . Occupation: disabiled  Social Needs  . Financial resource strain: Not very hard  . Food insecurity:    Worry: Never true    Inability: Never true  . Transportation needs:    Medical: No    Non-medical: No  Tobacco  Use  . Smoking status: Former Games developer  . Smokeless tobacco: Never Used  Substance and Sexual Activity  . Alcohol use: No  . Drug use: No  . Sexual activity: Not Currently  Lifestyle  . Physical activity:    Days per week: Not on file    Minutes per session: Not on file  . Stress: Not at all  Relationships  . Social connections:    Talks on phone: Once a week    Gets together: Once a week    Attends religious service: Patient refused    Active member of club or organization: No    Attends meetings of clubs or organizations: Never    Relationship status: Never married  Other Topics Concern  . Not on file  Social History Narrative  . Not on file    Allergies: No Known Allergies  Metabolic Disorder Labs: No results found for: HGBA1C, MPG No results found for: PROLACTIN No results found for: CHOL, TRIG, HDL, CHOLHDL, VLDL, LDLCALC No results found for: TSH  Therapeutic Level Labs: No results found for: LITHIUM No results found for: VALPROATE No components found for:  CBMZ  Current Medications: Current Outpatient Medications  Medication Sig Dispense Refill  . albuterol (PROVENTIL HFA;VENTOLIN HFA) 108 (90 Base) MCG/ACT inhaler Inhale 2 puffs into the lungs every 6 (six) hours as needed for wheezing. 1 Inhaler 0  .  ARIPiprazole (ABILIFY) 30 MG tablet Take 1 tablet (30 mg total) by mouth daily. 90 tablet 1  . benzonatate (TESSALON PERLES) 100 MG capsule Take 1 capsule (100 mg total) by mouth 3 (three) times daily as needed for cough. 15 capsule 0  . doxycycline (VIBRAMYCIN) 100 MG capsule Take 1 capsule (100 mg total) by mouth 2 (two) times daily. 20 capsule 0  . famotidine (PEPCID) 40 MG tablet Take 40 mg by mouth daily.    Marland Kitchen FLUCELVAX QUADRIVALENT 0.5 ML SUSY TO BE ADMINISTERED BY PHARMACIST FOR IMMUNIZATION  0  . predniSONE (DELTASONE) 20 MG tablet Take 2 tablets (40 mg total) by mouth daily. 10 tablet 0  . vitamin B-12 (CYANOCOBALAMIN) 1000 MCG tablet Take by mouth.      No current facility-administered medications for this visit.      Musculoskeletal: Strength & Muscle Tone: within normal limits Gait & Station: normal Patient leans: N/A  Psychiatric Specialty Exam: Review of Systems  Psychiatric/Behavioral: Negative for depression. The patient is not nervous/anxious.   All other systems reviewed and are negative.   Blood pressure 116/74, pulse 73, temperature 97.7 F (36.5 C), temperature source Oral, weight 160 lb 9.6 oz (72.8 kg).Body mass index is 23.04 kg/m.  General Appearance: Casual  Eye Contact:  Fair  Speech:  Normal Rate  Volume:  Normal  Mood:  Euthymic  Affect:  Congruent  Thought Process:  Goal Directed and Descriptions of Associations: Intact  Orientation:  Full (Time, Place, and Person)  Thought Content: Logical   Suicidal Thoughts:  No  Homicidal Thoughts:  No  Memory:  Immediate;   Fair Recent;   Fair Remote;   Fair  Judgement:  Fair  Insight:  Fair  Psychomotor Activity:  Normal  Concentration:  Concentration: Fair and Attention Span: Fair  Recall:  [employer] of Knowledge: Fair  Language: Fair  Akathisia:  No  Handed:  Right  AIMS (if indicated): 0  Assets:  Communication Skills Desire for Improvement Social Support  ADL's:  Intact  Cognition: WNL  Sleep:  Fair   Screenings: AIMS     Office Visit from 06/23/2018 in [facility] Office Visit from 01/29/2018 in [facility]  AIMS Total Score  0  0       Assessment and Plan: Randall Simon is a 57 year old African-American male who has a history of schizophrenia, presented to the clinic today with his mother.  He continues to do well on the current medication regimen.  Will continue plan as noted below.  Plan For schizophrenia in  remission Continue Abilify 30 mg p.o. daily. Discussed with patient as well as mother that if he continues to do well his dosage can be slowly tapered down during his next  visit.  Aims equals 0  Follow-up in clinic in 4-5 months or sooner if needed.  More than 50 % of the time was spent for psychoeducation and supportive psychotherapy and care coordination.  This note was generated in part or whole with voice recognition software. Voice recognition is usually quite accurate but there are transcription errors that can and very often do occur. I apologize for any typographical errors that were not detected and corrected.       Jomarie Longs, MD 06/23/2018, 4:45 PM

## 2018-11-16 ENCOUNTER — Other Ambulatory Visit: Payer: Self-pay | Admitting: Psychiatry

## 2018-11-16 DIAGNOSIS — F200 Paranoid schizophrenia: Secondary | ICD-10-CM

## 2018-12-01 ENCOUNTER — Other Ambulatory Visit: Payer: Self-pay

## 2018-12-01 ENCOUNTER — Ambulatory Visit (INDEPENDENT_AMBULATORY_CARE_PROVIDER_SITE_OTHER): Payer: Medicare Other | Admitting: Psychiatry

## 2018-12-01 ENCOUNTER — Encounter: Payer: Self-pay | Admitting: Psychiatry

## 2018-12-01 VITALS — BP 120/79 | HR 91 | Temp 97.4°F | Wt 161.4 lb

## 2018-12-01 DIAGNOSIS — F200 Paranoid schizophrenia: Secondary | ICD-10-CM

## 2018-12-01 MED ORDER — ARIPIPRAZOLE 5 MG PO TABS: 5.0000 mg | ORAL_TABLET | Freq: Every day | ORAL | 1 refills | Status: DC

## 2018-12-01 MED ORDER — ARIPIPRAZOLE 20 MG PO TABS: 20.0000 mg | ORAL_TABLET | Freq: Every day | ORAL | 1 refills | Status: DC

## 2018-12-01 NOTE — Progress Notes (Signed)
BH MD OP Progress Note  12/01/2018 4:57 PM Randall Simon  MRN:  [id number]  Chief Complaint: ' I am here for follow up." Chief Complaint    Follow-up     HPI: Randall Simon is a 58 yr old African-American male, single, lives in [place] with his mother, presented to the clinic today for a follow-up visit.  Patient has a history of paranoid schizophrenia.  Patient today presented along with his mother who provided collateral information.  Patient reports he continues to do well with regards to his mood symptoms.  He denies any sleep problems.  He reports appetite is fair.  He is compliant on his medications.  He denies side effects to the Abilify.  Discussed with patient since he has been stable on the Abilify for a long time that we can try reducing the dosage.  We will try to reduce the dosage to 25 mg.  Patient as well as mother will monitor his symptoms closely.  Discussed with patient as well as mother to call if he has any worsening mood symptoms after going on a reduced dosage.     Visit Diagnosis:    ICD-10-CM   1. Paranoid schizophrenia in remission (HCC) F20.0 ARIPiprazole (ABILIFY) 20 MG tablet    ARIPiprazole (ABILIFY) 5 MG tablet    Past Psychiatric History: I have reviewed past psychiatric history from my progress note on 10/20/2017.  Past Medical History:  Past Medical History:  Diagnosis Date  . Chronic kidney disease   . GERD (gastroesophageal reflux disease)   . Head injury   . Schizophrenia [facility])     Past Surgical History:  Procedure Laterality Date  . head surgery      Family Psychiatric History: I have reviewed family psychiatric history from my progress note on 10/20/2017.  Family History:  Family History  Problem Relation Age of Onset  . Suicidality Mother   . Heart attack Father     Social History: Reviewed social history from my progress note on 10/20/2017. Social History   Socioeconomic History  . Marital status: Single    Spouse name: Not on  file  . Number of children: 0  . Years of education: Not on file  . Highest education level: Not on file  Occupational History  . Occupation: disabiled  Social Needs  . Financial resource strain: Not very hard  . Food insecurity:    Worry: Never true    Inability: Never true  . Transportation needs:    Medical: No    Non-medical: No  Tobacco Use  . Smoking status: Former Games developer  . Smokeless tobacco: Never Used  Substance and Sexual Activity  . Alcohol use: No  . Drug use: No  . Sexual activity: Not Currently  Lifestyle  . Physical activity:    Days per week: Not on file    Minutes per session: Not on file  . Stress: Not at all  Relationships  . Social connections:    Talks on phone: Once a week    Gets together: Once a week    Attends religious service: Patient refused    Active member of club or organization: No    Attends meetings of clubs or organizations: Never    Relationship status: Never married  Other Topics Concern  . Not on file  Social History Narrative  . Not on file    Allergies: No Known Allergies  Metabolic Disorder Labs: No results found for: HGBA1C, MPG No results found for: PROLACTIN No  results found for: CHOL, TRIG, HDL, CHOLHDL, VLDL, LDLCALC No results found for: TSH  Therapeutic Level Labs: No results found for: LITHIUM No results found for: VALPROATE No components found for:  CBMZ  Current Medications: Current Outpatient Medications  Medication Sig Dispense Refill  . albuterol (PROVENTIL HFA;VENTOLIN HFA) 108 (90 Base) MCG/ACT inhaler Inhale 2 puffs into the lungs every 6 (six) hours as needed for wheezing. 1 Inhaler 0  . ARIPiprazole (ABILIFY) 20 MG tablet Take 1 tablet (20 mg total) by mouth daily. To be combined with 5 mg total 25 mg 30 tablet 1  . ARIPiprazole (ABILIFY) 5 MG tablet Take 1 tablet (5 mg total) by mouth daily. To be combined with 20 mg total 25 mg 30 tablet 1  . benzonatate (TESSALON PERLES) 100 MG capsule Take 1  capsule (100 mg total) by mouth 3 (three) times daily as needed for cough. 15 capsule 0  . doxycycline (VIBRAMYCIN) 100 MG capsule Take 1 capsule (100 mg total) by mouth 2 (two) times daily. 20 capsule 0  . famotidine (PEPCID) 40 MG tablet Take 40 mg by mouth daily.    Marland Kitchen. FLUCELVAX QUADRIVALENT 0.5 ML SUSY TO BE ADMINISTERED BY PHARMACIST FOR IMMUNIZATION  0  . predniSONE (DELTASONE) 20 MG tablet Take 2 tablets (40 mg total) by mouth daily. 10 tablet 0  . vitamin B-12 (CYANOCOBALAMIN) 1000 MCG tablet Take by mouth.     No current facility-administered medications for this visit.      Musculoskeletal: Strength & Muscle Tone: within normal limits Gait & Station: normal Patient leans: N/A  Psychiatric Specialty Exam: Review of Systems  Psychiatric/Behavioral: Negative for depression. The patient is not nervous/anxious.   All other systems reviewed and are negative.   Blood pressure 120/79, pulse 91, temperature (!) 97.4 F (36.3 C), temperature source Oral, weight 161 lb 6.4 oz (73.2 kg).Body mass index is 23.16 kg/m.  General Appearance: Casual  Eye Contact:  Fair  Speech:  Clear and Coherent  Volume:  Normal  Mood:  Euthymic  Affect:  Congruent  Thought Process:  Goal Directed and Descriptions of Associations: Intact  Orientation:  Full (Time, Place, and Person)  Thought Content: Logical   Suicidal Thoughts:  No  Homicidal Thoughts:  No  Memory:  Immediate;   Fair Recent;   Fair Remote;   Fair  Judgement:  Fair  Insight:  Fair  Psychomotor Activity:  Normal  Concentration:  Concentration: Fair and Attention Span: Fair  Recall:  [employer]  Fund of Knowledge: Fair  Language: Fair  Akathisia:  No  Handed:  Right  AIMS (if indicated): 0  Assets:  Communication Skills Desire for Improvement Social Support  ADL's:  Intact  Cognition: WNL  Sleep:  Fair   Screenings: AIMS     [facility]/23/2018 in [facility] Office Visit from 01/29/2018  in [facility]  AIMS Total Score  0  0       Assessment and Plan:Randall Simon is a 58 yr old African-American male who has a history of schizophrenia, presented to clinic today with his mother.  She continues to do well on the Abilify.  Will try to reduce the dosage since he has been doing well for so long.  Patient as well as mother agrees with plan.  Plan For schizophrenia in remission We will reduce Abilify to 25 mg p.o. daily. Discussed with patient as well as mother to monitor his symptoms closely and let writer know if he  has any worsening mood symptoms. Aims equals 0  Follow-up in clinic in 2 months or sooner if needed.  I have spent atleast 15 minutes  face to face with patient today. More than 50 % of the time was spent for psychoeducation and supportive psychotherapy and care coordination.  This note was generated in part or whole with voice recognition software. Voice recognition is usually quite accurate but there are transcription errors that can and very often do occur. I apologize for any typographical errors that were not detected and corrected.        Jomarie Longs, MD 12/01/2018, 4:57 PM

## 2019-01-12 ENCOUNTER — Telehealth: Payer: Self-pay

## 2019-01-12 DIAGNOSIS — F200 Paranoid schizophrenia: Secondary | ICD-10-CM

## 2019-01-12 MED ORDER — ARIPIPRAZOLE 20 MG PO TABS: 20.0000 mg | ORAL_TABLET | Freq: Every day | ORAL | 1 refills | Status: DC

## 2019-01-12 MED ORDER — ARIPIPRAZOLE 5 MG PO TABS: 5.0000 mg | ORAL_TABLET | Freq: Every day | ORAL | 1 refills | Status: DC

## 2019-01-12 NOTE — Telephone Encounter (Signed)
Sent meds to pharmacy at [facility]

## 2019-01-12 NOTE — Telephone Encounter (Signed)
pt needs a refill on his abilify needs a 10 day supply while mail order process rx. please send 10 day supply to [facility]

## 2019-01-26 ENCOUNTER — Other Ambulatory Visit: Payer: Self-pay | Admitting: Psychiatry

## 2019-01-26 ENCOUNTER — Telehealth: Payer: Self-pay

## 2019-01-26 DIAGNOSIS — F200 Paranoid schizophrenia: Secondary | ICD-10-CM

## 2019-01-26 NOTE — Telephone Encounter (Signed)
pt mother called states that patient insurance and pharmacy is requesting a 90 day supply of his medications. pt has appt on  01-28-19

## 2019-01-28 ENCOUNTER — Encounter: Payer: Self-pay | Admitting: Psychiatry

## 2019-01-28 ENCOUNTER — Other Ambulatory Visit: Payer: Self-pay

## 2019-01-28 ENCOUNTER — Ambulatory Visit (INDEPENDENT_AMBULATORY_CARE_PROVIDER_SITE_OTHER): Payer: Medicare Other | Admitting: Psychiatry

## 2019-01-28 DIAGNOSIS — F200 Paranoid schizophrenia: Secondary | ICD-10-CM | POA: Diagnosis not present

## 2019-01-28 MED ORDER — ARIPIPRAZOLE 20 MG PO TABS: 20.0000 mg | ORAL_TABLET | Freq: Every day | ORAL | 1 refills | Status: DC

## 2019-01-28 NOTE — Progress Notes (Signed)
Virtual Visit via Telephone Note  I connected with Randall Simon on 01/28/19 at  4:30 PM EDT by telephone and verified that I am speaking with the correct person using two identifiers.   I discussed the limitations, risks, security and privacy concerns of performing an evaluation and management service by telephone and the availability of in person appointments. I also discussed with the patient that there may be a patient responsible charge related to this service. The patient expressed understanding and agreed to proceed.    I discussed the assessment and treatment plan with the patient. The patient was provided an opportunity to ask questions and all were answered. The patient agreed with the plan and demonstrated an understanding of the instructions.   The patient was advised to call back or seek an in-person evaluation if the symptoms worsen or if the condition fails to improve as anticipated.   BH MD OP Progress Note  01/28/2019 5:08 PM DACK RIVETT  MRN:  [id number]  Chief Complaint:  Chief Complaint    Follow-up     HPI: Randall Simon is a 58 year old African-American male, single, lives in [place] with his mother, was evaluated by phone today.  Patient has a history of paranoid schizophrenia currently in remission.  His mother provided collateral information.  Patient reports he is trying to make use of all the precautions to stay safe from the COVID-19 outbreak.  He and his mother are staying home.  He reports he is not very anxious about the situation.  He has been coping okay.  He reports he is compliant on his medications.  He denies any mood symptoms.  He denies any perceptual disturbances.  He is tolerating the reduced dosage of Abilify 25 mg well.  He denies any side effects or worsening symptoms after the dosage reduction.  His mother provided collateral information and reported patient is doing well.  He has been spending time watching TV, doing chores around the  house and walking.  He denies any other concerns today. Visit Diagnosis:    ICD-10-CM   1. Paranoid schizophrenia (HCC) F20.0 ARIPiprazole (ABILIFY) 20 MG tablet    Past Psychiatric History: I have reviewed past psychiatric history from my progress note on 10/20/2017.  Past Medical History:  Past Medical History:  Diagnosis Date  . Chronic kidney disease   . GERD (gastroesophageal reflux disease)   . Head injury   . Schizophrenia [facility])     Past Surgical History:  Procedure Laterality Date  . head surgery      Family Psychiatric History: I have reviewed family psychiatric history from my progress note on 10/20/2017.  Family History:  Family History  Problem Relation Age of Onset  . Suicidality Mother   . Heart attack Father     Social History: I have reviewed social history from my progress note on 10/20/2017. Social History   Socioeconomic History  . Marital status: Single    Spouse name: Not on file  . Number of children: 0  . Years of education: Not on file  . Highest education level: Not on file  Occupational History  . Occupation: disabiled  Social Needs  . Financial resource strain: Not very hard  . Food insecurity:    Worry: Never true    Inability: Never true  . Transportation needs:    Medical: No    Non-medical: No  Tobacco Use  . Smoking status: Former Games developer  . Smokeless tobacco: Never Used  Substance and Sexual Activity  .  Alcohol use: No  . Drug use: No  . Sexual activity: Not Currently  Lifestyle  . Physical activity:    Days per week: Not on file    Minutes per session: Not on file  . Stress: Not at all  Relationships  . Social connections:    Talks on phone: Once a week    Gets together: Once a week    Attends religious service: Patient refused    Active member of club or organization: No    Attends meetings of clubs or organizations: Never    Relationship status: Never married  Other Topics Concern  . Not on file  Social History  Narrative  . Not on file    Allergies: No Known Allergies  Metabolic Disorder Labs: No results found for: HGBA1C, MPG No results found for: PROLACTIN No results found for: CHOL, TRIG, HDL, CHOLHDL, VLDL, LDLCALC No results found for: TSH  Therapeutic Level Labs: No results found for: LITHIUM No results found for: VALPROATE No components found for:  CBMZ  Current Medications: Current Outpatient Medications  Medication Sig Dispense Refill  . albuterol (PROVENTIL HFA;VENTOLIN HFA) 108 (90 Base) MCG/ACT inhaler Inhale 2 puffs into the lungs every 6 (six) hours as needed for wheezing. 1 Inhaler 0  . ARIPiprazole (ABILIFY) 20 MG tablet Take 1 tablet (20 mg total) by mouth daily. 90 tablet 1  . benzonatate (TESSALON PERLES) 100 MG capsule Take 1 capsule (100 mg total) by mouth 3 (three) times daily as needed for cough. 15 capsule 0  . doxycycline (VIBRAMYCIN) 100 MG capsule Take 1 capsule (100 mg total) by mouth 2 (two) times daily. 20 capsule 0  . famotidine (PEPCID) 40 MG tablet Take 40 mg by mouth daily.    Marland Kitchen. FLUCELVAX QUADRIVALENT 0.5 ML SUSY TO BE ADMINISTERED BY PHARMACIST FOR IMMUNIZATION  0  . predniSONE (DELTASONE) 20 MG tablet Take 2 tablets (40 mg total) by mouth daily. 10 tablet 0  . vitamin B-12 (CYANOCOBALAMIN) 1000 MCG tablet Take by mouth.     No current facility-administered medications for this visit.      Musculoskeletal: Strength & Muscle Tone: UTA Gait & Station: UTA Patient leans: N/A  Psychiatric Specialty Exam: Review of Systems  Psychiatric/Behavioral: The patient is not nervous/anxious and does not have insomnia.   All other systems reviewed and are negative.   There were no vitals taken for this visit.There is no height or weight on file to calculate BMI.  General Appearance: UTA  Eye Contact:  UTA  Speech:  Normal Rate  Volume:  Normal  Mood:  Euthymic  Affect:  UTA  Thought Process:  Goal Directed and Descriptions of Associations: Intact   Orientation:  Full (Time, Place, and Person)  Thought Content: Logical   Suicidal Thoughts:  No  Homicidal Thoughts:  No  Memory:  Immediate;   Fair Recent;   Fair Remote;   Fair  Judgement:  Fair  Insight:  Fair  Psychomotor Activity:  UTA  Concentration:  Concentration: Fair and Attention Span: Fair  Recall:  [employer]Fair  Fund of Knowledge: Fair  Language: Fair  Akathisia:  No  Handed:  Right  AIMS (if indicated): UTA  Assets:  Communication Skills Desire for Improvement Housing Social Support  ADL's:  Intact  Cognition: At baseline  Sleep:  Fair   Screenings: AIMS     [facility] Visit from 06/23/2018 in [facility] Office Visit from 01/29/2018 in [facility]  AIMS Total Score  0  0       Assessment and Plan: Randall Simon is a 58 year old African-American male who has a history of schizophrenia, lives in [place] with his mother, was evaluated by phone today.  Patient is currently doing well on the current medication regimen and denies any significant concerns.  Plan as noted below.  Plan For schizophrenia in remission Will reduce Abilify to 20 mg p.o. daily.  Discussed with patient as well as mother to monitor symptoms closely and let writer know if he has any worsening mood symptoms.  Follow-up in clinic in 2 to 3 months or sooner if needed.  I have spent atleast 15 minutes non face to face with patient today. More than 50 % of the time was spent for psychoeducation and supportive psychotherapy and care coordination.  This note was generated in part or whole with voice recognition software. Voice recognition is usually quite accurate but there are transcription errors that can and very often do occur. I apologize for any typographical errors that were not detected and corrected.        Jomarie Longs, MD 01/28/2019, 5:08 PM

## 2019-01-28 NOTE — Progress Notes (Signed)
TC on  01-28-19 @ 1:46 pt medical and surgical hx was reviewed with no updates.  Pt allergies was reviewed with no changes. Pt medications and pharmacy were reviewed with no updated. No vitals taken because this is a phone consult.

## 2019-03-25 ENCOUNTER — Telehealth: Payer: Self-pay

## 2019-03-25 NOTE — Telephone Encounter (Signed)
Patient called requesting a refill on his Abilify 20mg . He stated he has 1 week left of medication. Patient has a scheduled appointment on 05/17/19. Please review and advise. Thank you.

## 2019-03-26 NOTE — Telephone Encounter (Signed)
Patient 's Abilify was sent out on April for 90 days with 1 refill. He has enough for now.

## 2019-04-29 ENCOUNTER — Ambulatory Visit: Payer: Medicare Other | Admitting: Psychiatry

## 2019-05-17 ENCOUNTER — Ambulatory Visit (INDEPENDENT_AMBULATORY_CARE_PROVIDER_SITE_OTHER): Payer: Medicare Other | Admitting: Psychiatry

## 2019-05-17 ENCOUNTER — Encounter: Payer: Self-pay | Admitting: Psychiatry

## 2019-05-17 DIAGNOSIS — F209 Schizophrenia, unspecified: Secondary | ICD-10-CM | POA: Diagnosis not present

## 2019-05-17 NOTE — Progress Notes (Signed)
Virtual Visit via Telephone Note  I connected with Randall Simon on 05/17/19 at  4:30 PM EDT by telephone and verified that I am speaking with the correct person using two identifiers.   I discussed the limitations, risks, security and privacy concerns of performing an evaluation and management service by telephone and the availability of in person appointments. I also discussed with the patient that there may be a patient responsible charge related to this service. The patient expressed understanding and agreed to proceed.   I discussed the assessment and treatment plan with the patient. The patient was provided an opportunity to ask questions and all were answered. The patient agreed with the plan and demonstrated an understanding of the instructions.   The patient was advised to call back or seek an in-person evaluation if the symptoms worsen or if the condition fails to improve as anticipated.   BH MD OP Progress Note  05/17/2019 5:13 PM Randall CarasDonald C Saxer  MRN:  [id number]  Chief Complaint:  Chief Complaint    Follow-up     HPI: Randall Simon is a 58 year old African-American male, single, lives in [place], has a history of schizophrenia was evaluated by phone today.  Patient preferred to do a phone call.  Collateral information was obtained from mother-Ms. Latricia Heftosa Depass.  Patient appeared to be alert oriented to person place time and situation.  He reports he does not have any mood lability at this time.  He denies any perceptual disturbances.  He reports sleep is fair.  He is tolerating the Abilify 20 mg daily at this time.  Patient denies any side effects to the medication.  Per mother patient is doing okay so far except for some napping during the day at times.  She however declines the need for sleep medications today.  Patient as well as mother denies any other concerns today. Visit Diagnosis:    ICD-10-CM   1. Schizophrenia in remission (HCC)  F20.9     Past Psychiatric  History: I have reviewed past psychiatric history from my progress note on 10/20/2017.  Past Medical History:  Past Medical History:  Diagnosis Date  . Chronic kidney disease   . GERD (gastroesophageal reflux disease)   . Head injury   . Schizophrenia [facility](HCC)     Past Surgical History:  Procedure Laterality Date  . head surgery      Family Psychiatric History: I have reviewed family psychiatric history from my progress note on 10/20/2017.  Family History:  Family History  Problem Relation Age of Onset  . Suicidality Mother   . Heart attack Father     Social History: I have reviewed social history from my progress note on 10/20/2017. Social History   Socioeconomic History  . Marital status: Single    Spouse name: Not on file  . Number of children: 0  . Years of education: Not on file  . Highest education level: Not on file  Occupational History  . Occupation: disabiled  Social Needs  . Financial resource strain: Not very hard  . Food insecurity    Worry: Never true    Inability: Never true  . Transportation needs    Medical: No    Non-medical: No  Tobacco Use  . Smoking status: Former Games developermoker  . Smokeless tobacco: Never Used  Substance and Sexual Activity  . Alcohol use: No  . Drug use: No  . Sexual activity: Not Currently  Lifestyle  . Physical activity    Days per week: Not  on file    Minutes per session: Not on file  . Stress: Not at all  Relationships  . Social Herbalist on phone: Once a week    Gets together: Once a week    Attends religious service: Patient refused    Active member of club or organization: No    Attends meetings of clubs or organizations: Never    Relationship status: Never married  Other Topics Concern  . Not on file  Social History Narrative  . Not on file    Allergies: No Known Allergies  Metabolic Disorder Labs: No results found for: HGBA1C, MPG No results found for: PROLACTIN No results found for: CHOL, TRIG, HDL,  CHOLHDL, VLDL, LDLCALC No results found for: TSH  Therapeutic Level Labs: No results found for: LITHIUM No results found for: VALPROATE No components found for:  CBMZ  Current Medications: Current Outpatient Medications  Medication Sig Dispense Refill  . albuterol (PROVENTIL HFA;VENTOLIN HFA) 108 (90 Base) MCG/ACT inhaler Inhale 2 puffs into the lungs every 6 (six) hours as needed for wheezing. 1 Inhaler 0  . ARIPiprazole (ABILIFY) 20 MG tablet Take 1 tablet (20 mg total) by mouth daily. 90 tablet 1  . benzonatate (TESSALON PERLES) 100 MG capsule Take 1 capsule (100 mg total) by mouth 3 (three) times daily as needed for cough. 15 capsule 0  . doxycycline (VIBRAMYCIN) 100 MG capsule Take 1 capsule (100 mg total) by mouth 2 (two) times daily. 20 capsule 0  . famotidine (PEPCID) 40 MG tablet Take 40 mg by mouth daily.    Marland Kitchen FLUCELVAX QUADRIVALENT 0.5 ML SUSY TO BE ADMINISTERED BY PHARMACIST FOR IMMUNIZATION  0  . predniSONE (DELTASONE) 20 MG tablet Take 2 tablets (40 mg total) by mouth daily. 10 tablet 0  . vitamin B-12 (CYANOCOBALAMIN) 1000 MCG tablet Take by mouth.     No current facility-administered medications for this visit.      Musculoskeletal: Strength & Muscle Tone: UTA Gait & Station: Reports as WNL Patient leans: N/A  Psychiatric Specialty Exam: Review of Systems  Psychiatric/Behavioral: The patient is not nervous/anxious.   All other systems reviewed and are negative.   There were no vitals taken for this visit.There is no height or weight on file to calculate BMI.  General Appearance: Casual  Eye Contact:  Fair  Speech:  Clear and Coherent  Volume:  Normal  Mood:  Euthymic  Affect:  Appropriate  Thought Process:  Goal Directed and Descriptions of Associations: Intact  Orientation:  Full (Time, Place, and Person)  Thought Content: Logical   Suicidal Thoughts:  No  Homicidal Thoughts:  No  Memory:  Immediate;   Fair Recent;   Fair Remote;   Fair  Judgement:   Fair  Insight:  Fair  Psychomotor Activity:  Normal  Concentration:  Concentration: Fair and Attention Span: Fair  Recall:  [employer] of Knowledge: Fair  Language: Fair  Akathisia:  No  Handed:  Right  AIMS (if indicated): Denies tremors, rigidity  Assets:  Communication Skills Desire for Improvement Social Support  ADL's:  Intact  Cognition: WNL  Sleep:  Fair   Screenings: AIMS     Office Visit from 06/23/2018 in [facility] Office Visit from 01/29/2018 in [place] Total Score  0  0       Assessment and Plan: Randall Simon is a 58 year old African-American male who has a history of schizophrenia currently in remission, lives in  [place] with his mother was evaluated by phone today.  Patient is stable on the current medication regimen.  Plan as noted below.  Plan For schizophrenia in remission Abilify-reduced dosage of 20 mg p.o. daily  Collateral information was obtained from mother who reports he is doing well on the reduced dosage.  Follow-up in clinic in 2 to 3 months or sooner if needed.  November 18 at 4:30 PM.  I have spent atleast 15 minutes non face to face with patient today. More than 50 % of the time was spent for psychoeducation and supportive psychotherapy and care coordination.  This note was generated in part or whole with voice recognition software. Voice recognition is usually quite accurate but there are transcription errors that can and very often do occur. I apologize for any typographical errors that were not detected and corrected.        Jomarie LongsSaramma Ravan Schlemmer, MD 05/17/2019, 5:13 PM

## 2019-06-07 ENCOUNTER — Other Ambulatory Visit: Payer: Self-pay | Admitting: Psychiatry

## 2019-06-07 DIAGNOSIS — F200 Paranoid schizophrenia: Secondary | ICD-10-CM

## 2019-09-01 ENCOUNTER — Encounter: Payer: Self-pay | Admitting: Psychiatry

## 2019-09-01 ENCOUNTER — Ambulatory Visit (INDEPENDENT_AMBULATORY_CARE_PROVIDER_SITE_OTHER): Payer: Medicare Other | Admitting: Psychiatry

## 2019-09-01 ENCOUNTER — Other Ambulatory Visit: Payer: Self-pay

## 2019-09-01 DIAGNOSIS — F209 Schizophrenia, unspecified: Secondary | ICD-10-CM

## 2019-09-01 MED ORDER — ARIPIPRAZOLE 20 MG PO TABS: 20.0000 mg | ORAL_TABLET | Freq: Every day | ORAL | 1 refills | Status: DC

## 2019-09-01 NOTE — Progress Notes (Signed)
Virtual Visit via Telephone Note  I connected with Randall Simon on 09/01/19 at  4:30 PM EST by telephone and verified that I am speaking with the correct person using two identifiers.   I discussed the limitations, risks, security and privacy concerns of performing an evaluation and management service by telephone and the availability of in person appointments. I also discussed with the patient that there may be a patient responsible charge related to this service. The patient expressed understanding and agreed to proceed.     I discussed the assessment and treatment plan with the patient. The patient was provided an opportunity to ask questions and all were answered. The patient agreed with the plan and demonstrated an understanding of the instructions.   The patient was advised to call back or seek an in-person evaluation if the symptoms worsen or if the condition fails to improve as anticipated.  BH MD OP Progress Note  09/01/2019 4:48 PM Randall Simon  MRN:  [id number]  Chief Complaint:  Chief Complaint    Follow-up     HPI: Randall Simon is a 58 year old African-American male, single, lives in [place], has a history of schizophrenia was evaluated by phone today.  Patient preferred to do a phone call.  Collateral information was obtained from mother-[name].  Patient today appeared to be alert, oriented to person place time and situation.  He was pleasant and cooperative in session today.  He responded well to all questions asked.  He reports his mood is stable.  He denies any significant mood swings.  He denies any perceptual disturbances.  He reports sleep is good.  Per mother patient has been compliant on medications.  She however reports there are times especially at night when he is seen as preoccupied, looks up at the ceiling on and off.  She reports she is able to distract him and able to talk to him and this is something chronic and is not anything new.  She reports  the dosage decrease in Abilify did not affect him in a negative way.  He continues to do well.  He does have upcoming appointment for ultrasound for history of kidney disease.  She reports they will continue to follow-up with his providers for the same.  Patient denies any suicidality, homicidality or perceptual disturbances at this time.  Patient denies any other concerns today. Visit Diagnosis:    ICD-10-CM   1. 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  Past Medical History:  Past Medical History:  Diagnosis Date  . Chronic kidney disease   . GERD (gastroesophageal reflux disease)   . Head injury   . Schizophrenia [facility])     Past Surgical History:  Procedure Laterality Date  . head surgery      Family Psychiatric History: Reviewed family psychiatric history from my progress note on 10/20/2017.  Family History:  Family History  Problem Relation Age of Onset  . Suicidality Mother   . Heart attack Father     Social History: Reviewed social history from my progress note on 10/20/2017. Social History   Socioeconomic History  . Marital status: Single    Spouse name: Not on file  . Number of children: 0  . Years of education: Not on file  . Highest education level: Not on file  Occupational History  . Occupation: disabiled  Social Needs  . Financial  resource strain: Not very hard  . Food insecurity    Worry: Never true    Inability: Never true  . Transportation needs    Medical: No    Non-medical: No  Tobacco Use  . Smoking status: Former Research scientist (life sciences)  . Smokeless tobacco: Never Used  Substance and Sexual Activity  . Alcohol use: No  . Drug use: No  . Sexual activity: Not Currently  Lifestyle  . Physical activity    Days per week: Not on file    Minutes per session: Not on file  . Stress: Not at all  Relationships  . Social  Herbalist on phone: Once a week    Gets together: Once a week    Attends religious service: Patient refused    Active member of club or organization: No    Attends meetings of clubs or organizations: Never    Relationship status: Never married  Other Topics Concern  . Not on file  Social History Narrative  . Not on file    Allergies: No Known Allergies  Metabolic Disorder Labs: No results found for: HGBA1C, MPG No results found for: PROLACTIN No results found for: CHOL, TRIG, HDL, CHOLHDL, VLDL, LDLCALC No results found for: TSH  Therapeutic Level Labs: No results found for: LITHIUM No results found for: VALPROATE No components found for:  CBMZ  Current Medications: Current Outpatient Medications  Medication Sig Dispense Refill  . albuterol (PROVENTIL HFA;VENTOLIN HFA) 108 (90 Base) MCG/ACT inhaler Inhale 2 puffs into the lungs every 6 (six) hours as needed for wheezing. 1 Inhaler 0  . ARIPiprazole (ABILIFY) 20 MG tablet Take 1 tablet (20 mg total) by mouth daily. 90 tablet 1  . benzonatate (TESSALON PERLES) 100 MG capsule Take 1 capsule (100 mg total) by mouth 3 (three) times daily as needed for cough. 15 capsule 0  . doxycycline (VIBRAMYCIN) 100 MG capsule Take 1 capsule (100 mg total) by mouth 2 (two) times daily. 20 capsule 0  . famotidine (PEPCID) 40 MG tablet Take 40 mg by mouth daily.    Marland Kitchen FLUCELVAX QUADRIVALENT 0.5 ML SUSY TO BE ADMINISTERED BY PHARMACIST FOR IMMUNIZATION  0  . predniSONE (DELTASONE) 20 MG tablet Take 2 tablets (40 mg total) by mouth daily. 10 tablet 0  . vitamin B-12 (CYANOCOBALAMIN) 1000 MCG tablet Take by mouth.     No current facility-administered medications for this visit.      Musculoskeletal: Strength & Muscle Tone: UTA Gait & Station: Reports as WNL Patient leans: N/A  Psychiatric Specialty Exam: Review of Systems  Psychiatric/Behavioral: Negative for depression, hallucinations, substance abuse and suicidal ideas. The  patient is not nervous/anxious and does not have insomnia.   All other systems reviewed and are negative.   There were no vitals taken for this visit.There is no height or weight on file to calculate BMI.  General Appearance: UTA  Eye Contact:  UTA  Speech:  Clear and Coherent  Volume:  Normal  Mood:  Euthymic  Affect:  UTA  Thought Process:  Goal Directed and Descriptions of Associations: Intact  Orientation:  Full (Time, Place, and Person)  Thought Content: Logical   Suicidal Thoughts:  No  Homicidal Thoughts:  No  Memory:  Immediate;   Fair Recent;   Fair Remote;   Fair  Judgement:  Fair  Insight:  Fair  Psychomotor Activity:  UTA  Concentration:  Concentration: Fair and Attention Span: Fair  Recall:  [employer] of Knowledge: Fair  Language: Fair  Akathisia:  No  Handed:  Right  AIMS (if indicated): Denies tremors, rigidity  Assets:  Communication Skills Social Support  ADL's:  Intact  Cognition: WNL  Sleep:  Fair   Screenings: AIMS     [facility] 06/23/2018 [facility] Office Visit from 01/29/2018 in [facility]  AIMS Total Score  0  0       Assessment and Plan: Randall HillDonald is a 58 year old African-American male who has a history of schizophrenia currently in remission, lives in [place] with his mother was evaluated by phone today.  Patient is currently doing well.  Plan Schizophrenia in remission Abilify reduced dosage of 20 mg p.o. daily  Collateral information was obtained from mother who reports patient is doing well.  Advised him sign a release to obtain medical records from his provider who is managing his renal problems.  Follow-up in clinic in 3 to 4 months or sooner if needed.  March 11 at 4:40 PM  I have spent atleast 15 minutes non face to face with patient today. More than 50 % of the time was spent for psychoeducation and supportive psychotherapy and care coordination. This note was  generated in part or whole with voice recognition software. Voice recognition is usually quite accurate but there are transcription errors that can and very often do occur. I apologize for any typographical errors that were not detected and corrected.       Jomarie LongsSaramma Xan Sparkman, MD 09/01/2019, 4:48 PM

## 2019-09-30 LAB — HM HEPATITIS C SCREENING LAB: HM Hepatitis Screen: NEGATIVE

## 2019-09-30 LAB — HM HIV SCREENING LAB: HM HIV Screening: NEGATIVE

## 2019-12-23 ENCOUNTER — Other Ambulatory Visit: Payer: Self-pay

## 2019-12-23 ENCOUNTER — Ambulatory Visit: Payer: Medicare Other | Admitting: Psychiatry

## 2020-01-17 ENCOUNTER — Encounter: Payer: Self-pay | Admitting: Psychiatry

## 2020-01-17 ENCOUNTER — Ambulatory Visit (INDEPENDENT_AMBULATORY_CARE_PROVIDER_SITE_OTHER): Payer: Medicare Other | Admitting: Psychiatry

## 2020-01-17 ENCOUNTER — Other Ambulatory Visit: Payer: Self-pay

## 2020-01-17 DIAGNOSIS — F209 Schizophrenia, unspecified: Secondary | ICD-10-CM | POA: Diagnosis not present

## 2020-01-17 NOTE — Progress Notes (Signed)
Provider Location : [facility] Patient Location : Home   Virtual Visit via Telephone Note  I connected with Randall Simon on 01/17/20 at  4:20 PM EDT by telephone and verified that I am speaking with the correct person using two identifiers.   I discussed the limitations, risks, security and privacy concerns of performing an evaluation and management service by telephone and the availability of in person appointments. I also discussed with the patient that there may be a patient responsible charge related to this service. The patient expressed understanding and agreed to proceed.    I discussed the assessment and treatment plan with the patient. The patient was provided an opportunity to ask questions and all were answered. The patient agreed with the plan and demonstrated an understanding of the instructions.   The patient was advised to call back or seek an in-person evaluation if the symptoms worsen or if the condition fails to improve as anticipated.   BH MD OP Progress Note  01/17/2020 5:49 PM Randall Simon  MRN:  [id number]  Chief Complaint:  Chief Complaint    Follow-up     HPI: Randall Simon is a 59 year old African-American male, single, lives in [place] , has a history of schizophrenia was evaluated by phone today.  Patient preferred to do a phone call.  Collateral information was obtained from mother-Ms. Latricia Heft.  Per mother patient is currently doing well.  He did have recent kidney function abnormalities as well as lab abnormalities and is currently following up with his providers for the same.  He is overall doing okay.  He is not currently on any new medications and his kidney function although abnormal has been stable .  Patient today appeared to be alert oriented to person place time and situation.  He reports he is currently doing well on the current medications.  He denies any depression or anxiety.  Patient is sleeping well.  He reports his appetite is  fair.  He denies any suicidality, homicidality or perceptual disturbances.  He denies any side effects to medications.  He was able to get his COVID-19 vaccine.  He tolerated it well. Visit Diagnosis:    ICD-10-CM   1. 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 Chronic kidney disease   . GERD (gastroesophageal reflux disease)   . Head injury   . Schizophrenia [facility])     Past Surgical History:  Procedure Laterality Date  . head surgery      Family Psychiatric History: I have reviewed family psychiatric history from my progress note on 10/20/2017  Family History:  Family History  Problem Relation Age of Onset  . Suicidality Mother   . Heart attack Father     Social History: I have reviewed social history from my progress note on 10/20/2017 Social History   Socioeconomic History  . Marital status: Single    Spouse name: Not on file  . Number of children: 0  . Years of education: Not on file  . Highest education level: Not on file  Occupational History  . Occupation: disabiled  Tobacco Use  . Smoking status: Former Games developer  . Smokeless tobacco: Never Used  Substance and Sexual Activity  . Alcohol use: No  . Drug use: No  . Sexual activity: Not Currently  Other Topics Concern  . Not  on file  Social History Narrative  . Not on file   Social Determinants of Health   Financial Resource Strain:   . Difficulty of Paying Living Expenses:   Food Insecurity:   . Worried About Programme researcher, broadcasting/film/video in the Last Year:   . Barista in the Last Year:   Transportation Needs:   . Freight forwarder (Medical):   Marland Kitchen Lack of Transportation (Non-Medical):   Physical Activity:   . Days of Exercise per Week:   . Minutes of Exercise per Session:   Stress:   . Feeling of Stress :   Social Connections:    . Frequency of Communication with Friends and Family:   . Frequency of Social Gatherings with Friends and Family:   . Attends Religious Services:   . Active Member of Clubs or Organizations:   . Attends Banker Meetings:   Marland Kitchen Marital Status:     Allergies: No Known Allergies  Metabolic Disorder Labs: No results found for: HGBA1C, MPG No results found for: PROLACTIN No results found for: CHOL, TRIG, HDL, CHOLHDL, VLDL, LDLCALC No results found for: TSH  Therapeutic Level Labs: No results found for: LITHIUM No results found for: VALPROATE No components found for:  CBMZ  Current Medications: Current Outpatient Medications  Medication Sig Dispense Refill  . albuterol (PROVENTIL HFA;VENTOLIN HFA) 108 (90 Base) MCG/ACT inhaler Inhale 2 puffs into the lungs every 6 (six) hours as needed for wheezing. 1 Inhaler 0  . ARIPiprazole (ABILIFY) 20 MG tablet Take 1 tablet (20 mg total) by mouth daily. 90 tablet 1  . ascorbic acid (VITAMIN C) 100 MG tablet Take by mouth.    . benzonatate (TESSALON PERLES) 100 MG capsule Take 1 capsule (100 mg total) by mouth 3 (three) times daily as needed for cough. 15 capsule 0  . doxycycline (VIBRAMYCIN) 100 MG capsule Take 1 capsule (100 mg total) by mouth 2 (two) times daily. 20 capsule 0  . famotidine (PEPCID) 40 MG tablet Take 40 mg by mouth daily.    Marland Kitchen FLUCELVAX QUADRIVALENT 0.5 ML SUSY TO BE ADMINISTERED BY PHARMACIST FOR IMMUNIZATION  0  . Multiple Vitamins-Minerals (ONCOVITE) TABS Take by mouth.    . predniSONE (DELTASONE) 20 MG tablet Take 2 tablets (40 mg total) by mouth daily. 10 tablet 0  . vitamin B-12 (CYANOCOBALAMIN) 1000 MCG tablet Take by mouth.     No current facility-administered medications for this visit.     Musculoskeletal: Strength & Muscle Tone: UTA Gait & Station: UTA Patient leans: N/A  Psychiatric Specialty Exam: Review of Systems  Psychiatric/Behavioral: Negative for agitation, behavioral problems,  confusion, decreased concentration, dysphoric mood, hallucinations, self-injury, sleep disturbance and suicidal ideas. The patient is not nervous/anxious and is not hyperactive.   All other systems reviewed and are negative.   There were no vitals taken for this visit.There is no height or weight on file to calculate BMI.  General Appearance: UTA  Eye Contact:  UTA  Speech:  Clear and Coherent  Volume:  Normal  Mood:  Euthymic  Affect:  UTA  Thought Process:  Goal Directed and Descriptions of Associations: Intact  Orientation:  Full (Time, Place, and Person)  Thought Content: Logical   Suicidal Thoughts:  No  Homicidal Thoughts:  No  Memory:  Immediate;   Fair Recent;   Fair Remote;   Fair  Judgement:  Fair  Insight:  Fair  Psychomotor Activity:  UTA  Concentration:  Concentration: Fair and Attention  Span: Fair  Recall:  [employer] of Knowledge: Fair  Language: Fair  Akathisia:  No  Handed:  Right  AIMS (if indicated): UTA  Assets:  Communication Skills Desire for Improvement Housing Social Support  ADL's:  Intact  Cognition: WNL  Sleep:  Fair   Screenings: AIMS     Office Visit from 06/23/2018 in [place] Office Visit from 01/29/2018 in [place] Total Score  0  0       Assessment and Plan: Randall Simon is a 59 year old African-American male who has a history of schizophrenia currently in remission, lives in [place] with his mother was evaluated by phone today.  Patient is currently stable on current medication regimen.  Plan Schizophrenia in remission Abilify at reduced dosage of 20 mg p.o. daily  Collateral information was obtained from mother as summarized above.  I have reviewed notes in [facility] per Dr.Luyando -CKD -questionable etiology-stable for 10 years.  Patient referred for renal imaging and [facility] nephrology clinic.  Lipid panel-abnormal-patient advised to start low-carb diet. CBC-platelets-low  WBC-low-stable.  Likely due to Abilify. TSH-within normal limits.  Patient advised to continue to follow-up with his providers for managing his renal problems as well as hyperlipidemia and low platelet count.  Follow-up in clinic in 3 months or sooner if needed.  I have spent atleast 20 minutes non- face to face with patient today. More than 50 % of the time was spent for preparing to see the patient ( e.g., review of test, records ), obtaining and to review and separately obtained history , ordering medications and test ,psychoeducation and supportive psychotherapy and care coordination,as well as documenting clinical information in electronic health record. This note was generated in part or whole with voice recognition software. Voice recognition is usually quite accurate but there are transcription errors that can and very often do occur. I apologize for any typographical errors that were not detected and corrected.          Ursula Alert, MD 01/17/2020, 5:49 PM

## 2020-03-10 ENCOUNTER — Other Ambulatory Visit: Payer: Self-pay | Admitting: Psychiatry

## 2020-03-10 DIAGNOSIS — F209 Schizophrenia, unspecified: Secondary | ICD-10-CM

## 2020-04-20 ENCOUNTER — Encounter: Payer: Self-pay | Admitting: Psychiatry

## 2020-04-20 ENCOUNTER — Telehealth (INDEPENDENT_AMBULATORY_CARE_PROVIDER_SITE_OTHER): Payer: Medicare Other | Admitting: Psychiatry

## 2020-04-20 ENCOUNTER — Other Ambulatory Visit: Payer: Self-pay

## 2020-04-20 DIAGNOSIS — F209 Schizophrenia, unspecified: Secondary | ICD-10-CM | POA: Diagnosis not present

## 2020-04-20 NOTE — Progress Notes (Signed)
Provider Location : [facility] Patient Location : Home  Virtual Visit via Telephone Note  I connected with Randall Simon on 04/20/20 at  4:00 PM EDT by telephone and verified that I am speaking with the correct person using two identifiers.   I discussed the limitations, risks, security and privacy concerns of performing an evaluation and management service by telephone and the availability of in person appointments. I also discussed with the patient that there may be a patient responsible charge related to this service. The patient expressed understanding and agreed to proceed.   I discussed the assessment and treatment plan with the patient. The patient was provided an opportunity to ask questions and all were answered. The patient agreed with the plan and demonstrated an understanding of the instructions.   The patient was advised to call back or seek an in-person evaluation if the symptoms worsen or if the condition fails to improve as anticipated.   BH MD OP Progress Note  04/20/2020 4:09 PM Randall Simon  MRN:  [id number]  Chief Complaint:  Chief Complaint    Follow-up     HPI: Randall Simon is a 59 year old African-American male, single, lives in [place], has a history of schizophrenia was evaluated by phone today.  Patient preferred to do a phone call.  Collateral information was obtained from mother-Ms. Latricia Heft.  Patient today reports he is currently doing well.  He denies any mood lability.  He denies any hallucinations.  He reports sleep as good.  He reports appetite is fair.  Per mother patient is doing okay on the Abilify.  She reports he does have a colonoscopy scheduled, he seems to have mild abdominal problems, passing bowel right after he eats which has been going on for a very long time.  He also has some gas and bloating on and off.  She otherwise denies any significant concerns.  She reports he is sleeping okay and denies any significant mood lability or  hallucinations.      Visit Diagnosis:    ICD-10-CM   1. 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 Chronic kidney disease   . GERD (gastroesophageal reflux disease)   . Head injury   . Schizophrenia [facility])     Past Surgical History:  Procedure Laterality Date  . head surgery      Family Psychiatric History: Reviewed family psychiatric history from my progress note from 10/20/2017  Family History:  Family History  Problem Relation Age of Onset  . Suicidality Mother   . Heart attack Father     Social History: Reviewed social history from my progress note on 10/20/2017 Social History   Socioeconomic History  . Marital status: Single    Spouse name: Not on file  . Number of children: 0  . Years of education: Not on file  . Highest education level: Not on file  Occupational History  . Occupation: disabiled  Tobacco Use  . Smoking status: Former Games developer  . Smokeless tobacco: Never Used  Vaping Use  . Vaping Use: Never used  Substance and Sexual Activity  . Alcohol use: No  . Drug use: No  . Sexual activity: Not Currently  Other Topics Concern  . Not on file  Social History Narrative  . Not on file   Social Determinants of Health  Financial Resource Strain:   . Difficulty of Paying Living Expenses:   Food Insecurity:   . Worried About Programme researcher, broadcasting/film/video in the Last Year:   . Barista in the Last Year:   Transportation Needs:   . Freight forwarder (Medical):   Marland Kitchen Lack of Transportation (Non-Medical):   Physical Activity:   . Days of Exercise per Week:   . Minutes of Exercise per Session:   Stress:   . Feeling of Stress :   Social Connections:   . Frequency of Communication with Friends and Family:   . Frequency of Social Gatherings with Friends and Family:   . Attends  Religious Services:   . Active Member of Clubs or Organizations:   . Attends Banker Meetings:   Marland Kitchen Marital Status:     Allergies: No Known Allergies  Metabolic Disorder Labs: No results found for: HGBA1C, MPG No results found for: PROLACTIN No results found for: CHOL, TRIG, HDL, CHOLHDL, VLDL, LDLCALC No results found for: TSH  Therapeutic Level Labs: No results found for: LITHIUM No results found for: VALPROATE No components found for:  CBMZ  Current Medications: Current Outpatient Medications  Medication Sig Dispense Refill  . albuterol (PROVENTIL HFA;VENTOLIN HFA) 108 (90 Base) MCG/ACT inhaler Inhale 2 puffs into the lungs every 6 (six) hours as needed for wheezing. 1 Inhaler 0  . ARIPiprazole (ABILIFY) 20 MG tablet TAKE 1 TABLET BY MOUTH EVERY DAY 90 tablet 1  . ascorbic acid (VITAMIN C) 100 MG tablet Take by mouth.    . benzonatate (TESSALON PERLES) 100 MG capsule Take 1 capsule (100 mg total) by mouth 3 (three) times daily as needed for cough. 15 capsule 0  . doxycycline (VIBRAMYCIN) 100 MG capsule Take 1 capsule (100 mg total) by mouth 2 (two) times daily. 20 capsule 0  . famotidine (PEPCID) 40 MG tablet Take 40 mg by mouth daily.    Marland Kitchen FLUCELVAX QUADRIVALENT 0.5 ML SUSY TO BE ADMINISTERED BY PHARMACIST FOR IMMUNIZATION  0  . Multiple Vitamins-Minerals (ONCOVITE) TABS Take by mouth.    . polyethylene glycol-electrolytes (NULYTELY) 420 g solution Take 4,000 mLs by mouth as directed.    . predniSONE (DELTASONE) 20 MG tablet Take 2 tablets (40 mg total) by mouth daily. 10 tablet 0  . vitamin B-12 (CYANOCOBALAMIN) 1000 MCG tablet Take by mouth.     No current facility-administered medications for this visit.     Musculoskeletal: Strength & Muscle Tone: UTA Gait & Station: UTA Patient leans: N/A  Psychiatric Specialty Exam: Review of Systems  Psychiatric/Behavioral: Negative for agitation, behavioral problems, confusion, decreased concentration, dysphoric  mood, hallucinations, self-injury, sleep disturbance and suicidal ideas. The patient is not nervous/anxious and is not hyperactive.   All other systems reviewed and are negative.   There were no vitals taken for this visit.There is no height or weight on file to calculate BMI.  General Appearance: UTA  Eye Contact:  UTA  Speech:  Clear and Coherent  Volume:  Normal  Mood:  Euthymic  Affect:  UTA  Thought Process:  Goal Directed and Descriptions of Associations: Intact  Orientation:  Full (Time, Place, and Person)  Thought Content: Logical   Suicidal Thoughts:  No  Homicidal Thoughts:  No  Memory:  Immediate;   Fair Recent;   Fair Remote;   Fair  Judgement:  Fair  Insight:  Fair  Psychomotor Activity:  UTA  Concentration:  Concentration: Fair and Attention Span: Fair  Recall:  Fair  [employer] of Knowledge: Fair  Language: Fair  Akathisia:  No  Handed:  Right  AIMS (if indicated): UTA  Assets:  Communication Skills Desire for Improvement Housing Social Support  ADL's:  Intact  Cognition: WNL  Sleep:  Fair   Screenings: AIMS     [facility] Visit from 06/23/2018 in [facility] Office Visit from 01/29/2018 in [facility]  AIMS Total Score 0 0       Assessment and Plan: Randall Simon is a 59 year old African-American male who has a history of schizophrenia currently in remission, lives in [place] with his mother was evaluated by phone today.  Patient is currently stable on current medication regimen.  Plan Schizophrenia in remission Abilify at reduced dosage of 20 mg p.o. daily  Collateral information obtained from mother as summarized above.  Follow-up in clinic in 3 to 6 months or sooner if needed.  I have spent atleast 19 minutes non face to face with patient today. More than 50 % of the time was spent for preparing to see the patient ( e.g., review of test, records ), obtaining and to review and separately obtained  history , ordering medications and test ,psychoeducation and supportive psychotherapy and care coordination,as well as documenting clinical information in electronic health record.  This note was generated in part or whole with voice recognition software. Voice recognition is usually quite accurate but there are transcription errors that can and very often do occur. I apologize for any typographical errors that were not detected and corrected.         Jomarie Longs, MD 04/20/2020, 4:09 PM

## 2020-06-23 DIAGNOSIS — E786 Lipoprotein deficiency: Secondary | ICD-10-CM | POA: Insufficient documentation

## 2020-06-23 DIAGNOSIS — D72819 Decreased white blood cell count, unspecified: Secondary | ICD-10-CM | POA: Insufficient documentation

## 2020-09-03 ENCOUNTER — Other Ambulatory Visit: Payer: Self-pay | Admitting: Psychiatry

## 2020-09-03 DIAGNOSIS — F209 Schizophrenia, unspecified: Secondary | ICD-10-CM

## 2020-09-12 ENCOUNTER — Encounter: Payer: Self-pay | Admitting: Psychiatry

## 2020-09-12 ENCOUNTER — Telehealth (INDEPENDENT_AMBULATORY_CARE_PROVIDER_SITE_OTHER): Payer: Medicare Other | Admitting: Psychiatry

## 2020-09-12 ENCOUNTER — Other Ambulatory Visit: Payer: Self-pay

## 2020-09-12 DIAGNOSIS — F209 Schizophrenia, unspecified: Secondary | ICD-10-CM

## 2020-09-12 DIAGNOSIS — Z79899 Other long term (current) drug therapy: Secondary | ICD-10-CM

## 2020-09-12 NOTE — Progress Notes (Signed)
Virtual Visit via Telephone Note  I connected with Randall Simon on 09/12/20 at  4:00 PM EST by telephone and verified that I am speaking with the correct person using two identifiers.  Location Provider Location : [facility] Patient Location : Home  Participants: Patient ,Mother, Provider   I discussed the limitations, risks, security and privacy concerns of performing an evaluation and management service by telephone and the availability of in person appointments. I also discussed with the patient that there may be a patient responsible charge related to this service. The patient expressed understanding and agreed to proceed   I discussed the assessment and treatment plan with the patient. The patient was provided an opportunity to ask questions and all were answered. The patient agreed with the plan and demonstrated an understanding of the instructions.   The patient was advised to call back or seek an in-person evaluation if the symptoms worsen or if the condition fails to improve as anticipated.   BH MD OP Progress Note  09/12/2020 4:57 PM Randall Simon  MRN:  [id number]  Chief Complaint:  Chief Complaint    Follow-up     HPI: Randall Simon is a 59 year old African-American male, single, lives in [place], has a history of schizophrenia was evaluated by phone today.  Collateral information was obtained from mother-Ms. Latricia Heft since patient is a limited historian.  Patient today reports he is currently doing well.  He denies any suicidality, perceptual disturbances, homicidality.  He reports mood as good.  He reports sleep as good.  According to mother patient is stable on the current dosage of Abilify.  She has not observed him to have any hallucinations or being preoccupied with any paranoia or delusions.  Patient is compliant on medications.  Denies side effects.  Visit Diagnosis:    ICD-10-CM   1. 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 High risk  medication use  Z79.899 CBC With Diff/Platelet    Past Psychiatric History: I have reviewed past psychiatric history from my progress note on 10/20/2017  Past Medical History:  Past Medical History:  Diagnosis Date  . Chronic kidney disease   . GERD (gastroesophageal reflux disease)   . Head injury   . Schizophrenia [facility])     Past Surgical History:  Procedure Laterality Date  . head surgery      Family Psychiatric History: I have reviewed family psychiatric history from my progress note on 10/20/2017  Family History:  Family History  Problem Relation Age of Onset  . Suicidality Mother   . Heart attack Father     Social History: Reviewed social history from my progress note on 10/20/2017 Social History   Socioeconomic History  . Marital status: Single    Spouse name: Not on file  . Number of children: 0  . Years of education: Not on file  . Highest education level: Not on file  Occupational History  . Occupation: disabiled  Tobacco Use  . Smoking status: Former Games developer  . Smokeless tobacco: Never Used  Vaping Use  . Vaping Use: Never used  Substance and Sexual Activity  . Alcohol use: No  . Drug use: No  . Sexual activity: Not Currently  Other Topics Concern  . Not on file  Social History Narrative  . Not on file   Social Determinants of Health   Financial Resource Strain:   . Difficulty of Paying Living Expenses: Not on file  Food Insecurity:   . Worried About  Running Out of Food in the Last Year: Not on file  . Ran Out of Food in the Last Year: Not on file  Transportation Needs:   . Lack of Transportation (Medical): Not on file  . Lack of Transportation (Non-Medical): Not on file  Physical Activity:   . Days of Exercise per Week: Not on file  . Minutes of Exercise per Session: Not on file  Stress:   . Feeling of Stress : Not on file  Social Connections:   . Frequency of Communication with Friends and Family: Not on file  . Frequency of Social Gatherings  with Friends and Family: Not on file  . Attends Religious Services: Not on file  . Active Member of Clubs or Organizations: Not on file  . Attends Banker Meetings: Not on file  . Marital Status: Not on file    Allergies: No Known Allergies  Metabolic Disorder Labs: No results found for: HGBA1C, MPG No results found for: PROLACTIN No results found for: CHOL, TRIG, HDL, CHOLHDL, VLDL, LDLCALC No results found for: TSH  Therapeutic Level Labs: No results found for: LITHIUM No results found for: VALPROATE No components found for:  CBMZ  Current Medications: Current Outpatient Medications  Medication Sig Dispense Refill  . acetaminophen (TYLENOL) 500 MG tablet Take by mouth.    Marland Kitchen albuterol (PROVENTIL HFA;VENTOLIN HFA) 108 (90 Base) MCG/ACT inhaler Inhale 2 puffs into the lungs every 6 (six) hours as needed for wheezing. 1 Inhaler 0  . ARIPiprazole (ABILIFY) 20 MG tablet TAKE 1 TABLET BY MOUTH EVERY DAY 90 tablet 1  . ascorbic acid (VITAMIN C) 100 MG tablet Take by mouth.    . benzonatate (TESSALON PERLES) 100 MG capsule Take 1 capsule (100 mg total) by mouth 3 (three) times daily as needed for cough. 15 capsule 0  . doxycycline (VIBRAMYCIN) 100 MG capsule Take 1 capsule (100 mg total) by mouth 2 (two) times daily. 20 capsule 0  . famotidine (PEPCID) 40 MG tablet Take 40 mg by mouth daily.    Marland Kitchen FLUCELVAX QUADRIVALENT 0.5 ML SUSY TO BE ADMINISTERED BY PHARMACIST FOR IMMUNIZATION  0  . Multiple Vitamins-Minerals (ONCOVITE) TABS Take by mouth.    . polyethylene glycol-electrolytes (NULYTELY) 420 g solution Take 4,000 mLs by mouth as directed.    . predniSONE (DELTASONE) 20 MG tablet Take 2 tablets (40 mg total) by mouth daily. 10 tablet 0  . vitamin B-12 (CYANOCOBALAMIN) 1000 MCG tablet Take by mouth.     No current facility-administered medications for this visit.     Musculoskeletal: Strength & Muscle Tone: UTA Gait & Station: UTA Patient leans: N/A  Psychiatric  Specialty Exam: Review of Systems  Psychiatric/Behavioral: Negative for agitation, behavioral problems, confusion, decreased concentration, dysphoric mood, hallucinations, self-injury, sleep disturbance and suicidal ideas. The patient is not nervous/anxious and is not hyperactive.   All other systems reviewed and are negative.   There were no vitals taken for this visit.There is no height or weight on file to calculate BMI.  General Appearance: UTA  Eye Contact:  UTA  Speech:  Clear and Coherent  Volume:  Normal  Mood:  Euthymic  Affect:  UTA  Thought Process:  Goal Directed and Descriptions of Associations: Intact  Orientation:  Full (Time, Place, and Person)  Thought Content: Logical   Suicidal Thoughts:  No  Homicidal Thoughts:  No  Memory:  Immediate;   Fair Recent;   Fair Remote;   Fair  Judgement:  Fair  Insight:  Fair  Psychomotor Activity:  UTA  Concentration:  Concentration: Fair and Attention Span: Fair  Recall:  [employer] of Knowledge: Fair  Language: Fair  Akathisia:  No  Handed:  Right  AIMS (if indicated): UTA  Assets:  Desire for Improvement Housing Social Support  ADL's:  Intact  Cognition: WNL  Sleep:  Fair   Screenings: AIMS     Office Visit from 06/23/2018 in [facility] Office Visit from 01/29/2018 in [facility]  AIMS Total Score 0 0       Assessment and Plan: PAXSON HARROWER is a 59 year old African-American male, has a history of schizophrenia, lives in [place] with his mother was evaluated by phone today.  Patient is currently stable on current medication regimen.  Plan Schizophrenia in remission Abilify 20 mg p.o. daily-reduced dosage.  High risk medication use- patient with thrombocytopenia, leukopenia unspecified-labs reviewed from June 2021.  Patient had follow-up appointment with primary care provider and they will monitor patient closely.  Discussed repeating labs-will order  CBC with differential.  According to mother she wants to go back to primary care provider to repeat labs.  Also discussed reducing the Abilify further, however mother declined since she does not want patient to decompensate.  Collateral information obtained from mother as noted above.  Follow-up in clinic in 3 months or sooner if needed.   I have spent atleast 20 minutes non face to face  with patient today. More than 50 % of the time was spent for preparing to see the patient ( e.g., review of test, records ), obtaining and to review and separately obtained history , ordering medications and test ,psychoeducation and supportive psychotherapy and care coordination,as well as documenting clinical information in electronic health record. This note was generated in part or whole with voice recognition software. Voice recognition is usually quite accurate but there are transcription errors that can and very often do occur. I apologize for any typographical errors that were not detected and corrected.        Randall Longs, MD 09/13/2020, 8:24 AM

## 2020-10-02 ENCOUNTER — Telehealth: Payer: Self-pay | Admitting: *Deleted

## 2020-10-02 NOTE — Telephone Encounter (Signed)
I will print the order , please fax it to primary care . Thank you

## 2020-10-02 NOTE — Telephone Encounter (Signed)
Patients wife called and wants patients labs drawn at her primary care at [facility] [phone]).  I saw orders were put in on 11/30.  I called the PCP and they cant see that order.  Could you resend them so that [facility] can find them in Epic and set this up with patient. Thanks.

## 2020-10-02 NOTE — Telephone Encounter (Signed)
done

## 2020-12-20 ENCOUNTER — Other Ambulatory Visit: Payer: Self-pay

## 2020-12-20 ENCOUNTER — Encounter: Payer: Self-pay | Admitting: Psychiatry

## 2020-12-20 ENCOUNTER — Ambulatory Visit (INDEPENDENT_AMBULATORY_CARE_PROVIDER_SITE_OTHER): Payer: Medicare Other | Admitting: Psychiatry

## 2020-12-20 VITALS — BP 132/78 | HR 80 | Temp 98.0°F

## 2020-12-20 DIAGNOSIS — Z79899 Other long term (current) drug therapy: Secondary | ICD-10-CM

## 2020-12-20 DIAGNOSIS — F209 Schizophrenia, unspecified: Secondary | ICD-10-CM

## 2020-12-20 NOTE — Progress Notes (Signed)
BH MD OP Progress Note  12/20/2020 6:49 PM Randall Simon  MRN:  [id number]  Chief Complaint:  Chief Complaint    Follow-up     HPI: Randall Simon is a 60 year old African-American male, single, lives in [place] , has a history of schizophrenia was evaluated in office today.  Collateral information was obtained from mother-[name] who was present.  Patient is a limited historian.  He was able to answer questions in short phrases.  Patient with minimal eye contact.  Patient responded yes or no to most of the questions.  He reports his mood as excellent .  Patient denies any hallucinations or other perceptual disturbances.  Patient denies any suicidality or homicidality.  He reports he is compliant on medications.  Denies any side effects.  He reports his appetite is good.  Mother reports patient is doing well.  She reports he spends his time taking care of his cat.  Patient has been following up with his primary care providers.  He had recent blood work-up done. She reports she has to take him for his annual physical exam soon.  She agrees to discuss results with providers at that time.  Mother reports he is doing well on the current dosage of Abilify and she wants him to continue the same.  Denies any other concerns today.  Visit Diagnosis:    ICD-10-CM   1. 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 High risk medication use  Z79.899 Hemoglobin A1C    Prolactin    Past Psychiatric History: I have reviewed past psychiatric history from my progress note on 10/20/2017  Past Medical History:  Past Medical History:  Diagnosis Date  . Chronic kidney disease   . GERD (gastroesophageal reflux disease)   . Head injury   . Schizophrenia [facility])     Past Surgical History:  Procedure Laterality Date  . head surgery      Family Psychiatric History: I have reviewed family psychiatric history from my progress note on 10/20/2017  Family History:  Family History  Problem  Relation Age of Onset  . Suicidality Mother   . Heart attack Father     Social History: I have reviewed social history from my progress note on 10/20/2017 Social History   Socioeconomic History  . Marital status: Single    Spouse name: Not on file  . Number of children: 0  . Years of education: Not on file  . Highest education level: Not on file  Occupational History  . Occupation: disabiled  Tobacco Use  . Smoking status: Former Games developer  . Smokeless tobacco: Never Used  Vaping Use  . Vaping Use: Never used  Substance and Sexual Activity  . Alcohol use: No  . Drug use: No  . Sexual activity: Not Currently  Other Topics Concern  . Not on file  Social History Narrative  . Not on file   Social Determinants of Health   Financial Resource Strain: Not on file  Food Insecurity: Not on file  Transportation Needs: Not on file  Physical Activity: Not on file  Stress: Not on file  Social Connections: Not on file    Allergies: No Known Allergies  Metabolic Disorder Labs: No results found for: HGBA1C, MPG No results found for: PROLACTIN No results found for: CHOL, TRIG, HDL, CHOLHDL, VLDL, LDLCALC No results found for: TSH  Therapeutic Level Labs: No results found for: LITHIUM No results found for: VALPROATE No components found for:  CBMZ  Current Medications: Current Outpatient Medications  Medication Sig Dispense Refill  . acetaminophen (TYLENOL) 500 MG tablet Take by mouth.    Marland Kitchen albuterol (PROVENTIL HFA;VENTOLIN HFA) 108 (90 Base) MCG/ACT inhaler Inhale 2 puffs into the lungs every 6 (six) hours as needed for wheezing. 1 Inhaler 0  . ARIPiprazole (ABILIFY) 20 MG tablet TAKE 1 TABLET BY MOUTH EVERY DAY 90 tablet 1  . ascorbic acid (VITAMIN C) 100 MG tablet Take by mouth.    . doxycycline (VIBRAMYCIN) 100 MG capsule Take 1 capsule (100 mg total) by mouth 2 (two) times daily. 20 capsule 0  . famotidine (PEPCID) 40 MG tablet Take 40 mg by mouth daily.    . Multiple  Vitamins-Minerals (ONCOVITE) TABS Take by mouth.    . vitamin B-12 (CYANOCOBALAMIN) 1000 MCG tablet Take by mouth.     No current facility-administered medications for this visit.     Musculoskeletal: Strength & Muscle Tone: UTA Gait & Station: normal Patient leans: N/A  Psychiatric Specialty Exam: Review of Systems  Psychiatric/Behavioral: Negative for agitation, behavioral problems, confusion, decreased concentration, dysphoric mood, hallucinations, self-injury, sleep disturbance and suicidal ideas. The patient is not nervous/anxious and is not hyperactive.   All other systems reviewed and are negative.   Blood pressure 132/78, pulse 80, temperature 98 F (36.7 C), temperature source Tympanic.There is no height or weight on file to calculate BMI.  General Appearance: Casual  Eye Contact:  Minimal  Speech:  Normal Rate  Volume:  Decreased  Mood:  Euthymic  Affect:  Congruent  Thought Process:  Goal Directed and Descriptions of Associations: Intact  Orientation:  Full (Time, Place, and Person)  Thought Content: Logical   Suicidal Thoughts:  No  Homicidal Thoughts:  No  Memory:  Immediate;   Fair Recent;   Fair Remote;   Limited  Judgement:  Fair  Insight:  Shallow  Psychomotor Activity:  Normal  Concentration:  Concentration: Fair and Attention Span: Fair  Recall:  [employer] of Knowledge: Limited  Language: Fair  Akathisia:  No  Handed:  Right  AIMS (if indicated): done  Assets:  Communication Skills Desire for Improvement Housing Social Support  ADL's:  Intact  Cognition: At baseline   Sleep:  Fair   Screenings: AIMS   Flowsheet Row Office Visit from 06/23/2018 in [facility] Office Visit from 01/29/2018 in [facility]  AIMS Total Score 0 0    PHQ2-9   Flowsheet Row Office Visit from 12/20/2020 in [facility]  PHQ-2 Total Score 0    Flowsheet Row Office Visit from 12/20/2020  in [facility]  C-SSRS RISK CATEGORY No Risk       Assessment and Plan: Randall Simon is a 60 year old African-American male who has a history of schizophrenia, lives in [place] with his mother, was evaluated in office today.  Patient is currently stable.  Plan as noted below.  Plan Schizophrenia in remission Abilify 20 mg p.o. daily-reduced dosage  High risk medication use-will order hemoglobin A1c, prolactin. Patient with CBC with differential-abnormality-reviewed and discussed results from 10/05/2020-absolute neutrophil count-1.9.  However chronic.  Platelet uptrending.  Continue follow-up with primary provider.  Patient will benefit from an EKG.  Will discuss with primary care provider at his next visit.  Collateral information obtained from mother-Ms. [employer] as noted above.  Follow-up in clinic in 4 to 5 months or sooner if needed.  This note was generated in part or whole with voice  recognition software. Voice recognition is usually quite accurate but there are transcription errors that can and very often do occur. I apologize for any typographical errors that were not detected and corrected.        Randall Longs, MD 12/20/2020, 6:49 PM

## 2020-12-29 LAB — HEMOGLOBIN A1C
Est. average glucose Bld gHb Est-mCnc: 114 mg/dL
Hgb A1c MFr Bld: 5.6 % (ref 4.8–5.6)

## 2020-12-29 LAB — PROLACTIN: Prolactin: 2.4 ng/mL — ABNORMAL LOW (ref 4.0–15.2)

## 2021-02-25 ENCOUNTER — Other Ambulatory Visit: Payer: Self-pay | Admitting: Psychiatry

## 2021-02-25 DIAGNOSIS — F209 Schizophrenia, unspecified: Secondary | ICD-10-CM

## 2021-05-22 ENCOUNTER — Ambulatory Visit (INDEPENDENT_AMBULATORY_CARE_PROVIDER_SITE_OTHER): Payer: Medicare Other | Admitting: Psychiatry

## 2021-05-22 ENCOUNTER — Other Ambulatory Visit: Payer: Self-pay

## 2021-05-22 ENCOUNTER — Encounter: Payer: Self-pay | Admitting: Psychiatry

## 2021-05-22 VITALS — BP 118/79 | HR 73 | Temp 97.7°F | Ht 69.69 in | Wt 160.8 lb

## 2021-05-22 DIAGNOSIS — F209 Schizophrenia, unspecified: Secondary | ICD-10-CM | POA: Diagnosis not present

## 2021-05-22 DIAGNOSIS — Z79899 Other long term (current) drug therapy: Secondary | ICD-10-CM

## 2021-05-22 NOTE — Progress Notes (Signed)
BH MD OP Progress Note  05/22/2021 11:20 AM Randall Simon  MRN:  [id number]  Chief Complaint:  Chief Complaint   Follow-up; Depression; Hallucinations    HPI: Randall Simon is a 60 year old African-American male, single, lives in [place], has a history of schizophrenia was evaluated in office today.  History provided by-mother Ms. Rosa Michelsen-80% Patient-20%  According to mother patient is currently doing well.  He celebrated his birthday yesterday.  He continues to be helpful at home with chores.  She has not noticed any changes in his mood, hallucinations or other problems recently.  He is compliant on medications.  Denies any side effects.  Patient reports he is doing well.  He reports sleep and appetite is good.  Patient denies any suicidality, homicidality or perceptual disturbances.  Patient denies any other concerns.  Patient with history of stage III chronic kidney disease stage reviewed recent notes for [facility] -nephrology, [facility], creatinine stable,UACR normal on 04/05/2021.  Visit Diagnosis:    ICD-10-CM   1. 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 High risk medication use  Z79.899 TSH      Past Psychiatric History: Reviewed past psychiatric history from progress note from 10/20/2017  Past Medical History:  Past Medical History:  Diagnosis Date   Chronic kidney disease    GERD (gastroesophageal reflux disease)    Head injury    Schizophrenia (HCC)     Past Surgical History:  Procedure Laterality Date   head surgery      Family Psychiatric History: Manage family psychiatric history from progress note on 10/20/2017  Family History:  Family History  Problem Relation Age of Onset   Suicidality Mother    Heart attack Father     Social History: Reviewed social history from progress note on 10/20/2017 Social History   Socioeconomic History   Marital status: Single    Spouse name: Not on file   Number of children: 0   Years of  education: Not on file   Highest education level: Not on file  Occupational History   Occupation: disabiled  Tobacco Use   Smoking status: Former   Smokeless tobacco: Never  Building services engineer Use: Never used  Substance and Sexual Activity   Alcohol use: No   Drug use: No   Sexual activity: Not Currently  Other Topics Concern   Not on file  Social History Narrative   Not on file   Social Determinants of Health   Financial Resource Strain: Not on file  Food Insecurity: Not on file  Transportation Needs: Not on file  Physical Activity: Not on file  Stress: Not on file  Social Connections: Not on file    Allergies: No Known Allergies  Metabolic Disorder Labs: Lab Results  Component Value Date   HGBA1C 5.6 12/28/2020   Lab Results  Component Value Date   PROLACTIN 2.4 (L) 12/28/2020   No results found for: CHOL, TRIG, HDL, CHOLHDL, VLDL, LDLCALC No results found for: TSH  Therapeutic Level Labs: No results found for: LITHIUM No results found for: VALPROATE No components found for:  CBMZ  Current Medications: Current Outpatient Medications  Medication Sig Dispense Refill   acetaminophen (TYLENOL) 500 MG tablet Take by mouth.     albuterol (PROVENTIL HFA;VENTOLIN HFA) 108 (90 Base) MCG/ACT inhaler Inhale 2 puffs into the lungs every 6 (six) hours as needed for wheezing. 1 Inhaler 0   ARIPiprazole (ABILIFY) 20 MG tablet TAKE 1 TABLET BY  MOUTH EVERY DAY 90 tablet 1   ascorbic acid (VITAMIN C) 100 MG tablet Take by mouth.     doxycycline (VIBRAMYCIN) 100 MG capsule Take 1 capsule (100 mg total) by mouth 2 (two) times daily. 20 capsule 0   famotidine (PEPCID) 40 MG tablet Take 40 mg by mouth daily.     Multiple Vitamins-Minerals (ONCOVITE) TABS Take by mouth.     vitamin B-12 (CYANOCOBALAMIN) 1000 MCG tablet Take by mouth.     No current facility-administered medications for this visit.     Musculoskeletal: Strength & Muscle Tone: within normal limits Gait &  Station: normal Patient leans: N/A  Psychiatric Specialty Exam: Review of Systems  Unable to perform ROS: Psychiatric disorder   Blood pressure 118/79, pulse 73, temperature 97.7 F (36.5 C), temperature source Temporal, height 5' 9.69" (1.77 m), weight 160 lb 12.8 oz (72.9 kg).Body mass index is 23.28 kg/m.  General Appearance: Casual  Eye Contact:  Minimal  Speech:  Normal Rate  Volume:  Decreased  Mood:  Euthymic  Affect:  Congruent  Thought Process:  Goal Directed and Descriptions of Associations: Intact  Orientation:  Full (Time, Place, and Person)  Thought Content: Logical   Suicidal Thoughts:  No  Homicidal Thoughts:  No  Memory:  Immediate;   Fair Recent;   limited Remote;   limited  Judgement:  Fair  Insight:  Shallow  Psychomotor Activity:  Normal  Concentration:  Concentration: fair and Attention Span: Fair  Recall:   limited  Fund of Knowledge:  limited  Language: Fair  Akathisia:  No  Handed:  Right  AIMS (if indicated): done  Assets:  Housing Social Support Transportation  ADL's:  Intact  Cognition: limited - baseline  Sleep:  Fair   Screenings: AIMS    Flowsheet Row Office Visit from 05/22/2021 in [facility] Office Visit from 06/23/2018 in [facility] Office Visit from 01/29/2018 in [facility]  AIMS Total Score 0 0 0      GAD-7    Flowsheet Row Office Visit from 05/22/2021 in [facility]  Total GAD-7 Score 0      PHQ2-9    Flowsheet Row Office Visit from 05/22/2021 in [facility] Office Visit from 12/20/2020 in [facility]  PHQ-2 Total Score 0 0  PHQ-9 Total Score 0 --      Flowsheet Row Office Visit from 05/22/2021 in [facility] Office Visit from 12/20/2020 in [facility]  C-SSRS RISK CATEGORY No Risk No Risk         Assessment and Plan: Randall Simon is a 60 year old African-American male who has a history of schizophrenia, lives in [place], was evaluated in office.  Patient is currently stable.  Plan Schizophrenia in remission Abilify 20 mg p.o. daily-reduced dosage  High risk medication use-reviewed and discussed hemoglobin A1c-dated 12/28/2020-5.6-within normal limits, prolactin-2.4-low Will order TSH.  Provided lab slip.  Collateral information was provided by mother as noted above.  Follow-up in clinic in 5 months or sooner in the office.  This note was generated in part or whole with voice recognition software. Voice recognition is usually quite accurate but there are transcription errors that can and very often do occur. I apologize for any typographical errors that were not detected and corrected.       Jomarie Longs, MD 05/23/2021, 1:31 PM

## 2021-07-30 ENCOUNTER — Telehealth: Payer: Self-pay

## 2021-07-30 DIAGNOSIS — F209 Schizophrenia, unspecified: Secondary | ICD-10-CM

## 2021-07-30 MED ORDER — ARIPIPRAZOLE 20 MG PO TABS: 20.0000 mg | ORAL_TABLET | Freq: Every day | ORAL | 1 refills | Status: DC

## 2021-07-30 NOTE — Telephone Encounter (Signed)
message left that pt needed refills on all medication to be sent to [facility] in [place].

## 2021-07-30 NOTE — Telephone Encounter (Signed)
I have sent Abilify to pharmacy. 

## 2021-10-23 ENCOUNTER — Encounter: Payer: Self-pay | Admitting: Psychiatry

## 2021-10-23 ENCOUNTER — Other Ambulatory Visit: Payer: Self-pay

## 2021-10-23 ENCOUNTER — Ambulatory Visit (INDEPENDENT_AMBULATORY_CARE_PROVIDER_SITE_OTHER): Payer: 59 | Admitting: Psychiatry

## 2021-10-23 VITALS — BP 131/78 | HR 96 | Temp 98.1°F | Wt 159.6 lb

## 2021-10-23 DIAGNOSIS — F209 Schizophrenia, unspecified: Secondary | ICD-10-CM

## 2021-10-23 MED ORDER — ARIPIPRAZOLE 20 MG PO TABS: 20.0000 mg | ORAL_TABLET | Freq: Every day | ORAL | 3 refills | Status: DC

## 2021-10-23 NOTE — Progress Notes (Signed)
BH MD OP Progress Note  10/23/2021 2:58 PM Randall Simon  MRN:  [id number]  Chief Complaint:  Chief Complaint   Follow-up; Depression; Hallucinations    HPI: Randall Simon is a 61 year old African-American male, single, lives in [place], has a history of schizophrenia was evaluated in office today.  Patient today reports he is currently doing fairly well.  Had a good holiday season with his family.  Patient denies any significant sadness or anxiety symptoms.  Patient did not seem to be preoccupied with any delusions.  Denies any perceptual disturbances.  Reports he is compliant on his Abilify.  Denies side effects.  Reports sleep as good.  Reports appetite is good.  Reports he spends his time watching TV and playing basketball with his cousin.  Denies any other concerns today.  Visit Diagnosis:    ICD-10-CM   1. 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  Past Medical History:  Past Medical History:  Diagnosis Date   Chronic kidney disease    GERD (gastroesophageal reflux disease)    Head injury    Schizophrenia [facility])     Past Surgical History:  Procedure Laterality Date   head surgery      Family Psychiatric History: Reviewed family psychiatric history from progress note on 10/20/2017.  Family History:  Family History  Problem Relation Age of Onset   Suicidality Mother    Heart attack Father     Social History: Reviewed social history from progress note on 10/20/2017. Social History   Socioeconomic History   Marital status: Single    Spouse name: Not on file   Number of children: 0   Years of education: Not on file   Highest education level: Not on file  Occupational History   Occupation: disabiled  Tobacco Use   Smoking status: Former   Smokeless tobacco: Never  Building services engineer Use: Never used  Substance and  Sexual Activity   Alcohol use: No   Drug use: No   Sexual activity: Not Currently  Other Topics Concern   Not on file  Social History Narrative   Not on file   Social Determinants of Health   Financial Resource Strain: Not on file  Food Insecurity: Not on file  Transportation Needs: Not on file  Physical Activity: Not on file  Stress: Not on file  Social Connections: Not on file    Allergies: No Known Allergies  Metabolic Disorder Labs: Lab Results  Component Value Date   HGBA1C 5.6 12/28/2020   Lab Results  Component Value Date   PROLACTIN 2.4 (L) 12/28/2020   No results found for: CHOL, TRIG, HDL, CHOLHDL, VLDL, LDLCALC No results found for: TSH  Therapeutic Level Labs: No results found for: LITHIUM No results found for: VALPROATE No components found for:  CBMZ  Current Medications: Current Outpatient Medications  Medication Sig Dispense Refill   acetaminophen (TYLENOL) 500 MG tablet Take by mouth.     albuterol (PROVENTIL HFA;VENTOLIN HFA) 108 (90 Base) MCG/ACT inhaler Inhale 2 puffs into the lungs every 6 (six) hours as needed for wheezing. 1 Inhaler 0   ascorbic acid (VITAMIN C) 100 MG tablet Take by mouth.     doxycycline (VIBRAMYCIN) 100 MG capsule Take 1 capsule (100 mg total) by mouth 2 (two) times daily. 20 capsule 0   famotidine (PEPCID) 40 MG tablet Take 40  mg by mouth daily.     Multiple Vitamins-Minerals (ONCOVITE) TABS Take by mouth.     vitamin B-12 (CYANOCOBALAMIN) 1000 MCG tablet Take by mouth.     ARIPiprazole (ABILIFY) 20 MG tablet Take 1 tablet (20 mg total) by mouth daily. 90 tablet 3   No current facility-administered medications for this visit.     Musculoskeletal: Strength & Muscle Tone: within normal limits Gait & Station: normal Patient leans: N/A  Psychiatric Specialty Exam: Review of Systems  Psychiatric/Behavioral:  Negative for agitation, behavioral problems, confusion, decreased concentration, dysphoric mood, hallucinations,  self-injury, sleep disturbance and suicidal ideas. The patient is not nervous/anxious and is not hyperactive.   All other systems reviewed and are negative.  Blood pressure 131/78, pulse 96, temperature 98.1 F (36.7 C), temperature source Temporal, weight 159 lb 9.6 oz (72.4 kg).Body mass index is 23.11 kg/m.  General Appearance: Casual  Eye Contact:  Minimal  Speech:  Normal Rate  Volume:  Decreased  Mood:  Euthymic  Affect:  Congruent  Thought Process:  Goal Directed and Descriptions of Associations: Intact  Orientation:  Full (Time, Place, and Person)  Thought Content: Logical   Suicidal Thoughts:  No  Homicidal Thoughts:  No  Memory:  Immediate;   Fair Recent;   Fair Remote;   Poor  Judgement:  Fair  Insight:  Fair  Psychomotor Activity:  Normal  Concentration:  Concentration: Fair and Attention Span: Fair  Recall:  [employer] of Knowledge: Fair  Language: Fair  Akathisia:  No  Handed:  Right  AIMS (if indicated): done, 0  Assets:  Communication Skills Desire for Improvement Housing Social Support  ADL's:  Intact  Cognition: baseline  Sleep:  Fair   Screenings: AIMS    Flowsheet Row Office Visit from 05/22/2021 in [facility] Office Visit from 06/23/2018 in [facility] Office Visit from 01/29/2018 in [facility]  AIMS Total Score 0 0 0      GAD-7    Flowsheet Row Office Visit from 05/22/2021 in [facility]  Total GAD-7 Score 0      PHQ2-9    Flowsheet Row Office Visit from 10/23/2021 in [facility] Office Visit from 05/22/2021 in [facility] Office Visit from 12/20/2020 in [facility]  PHQ-2 Total Score 0 0 0  PHQ-9 Total Score -- 0 --      Flowsheet Row Office Visit from 05/22/2021 in [facility] Office Visit from 12/20/2020 in [facility]  C-SSRS RISK CATEGORY No Risk No Risk        Assessment and Plan: Randall Simon is a 61 year old African-American male who has a history of schizophrenia, lives in [place] was evaluated in office today.  Patient continues to be compliant on medications and is stable.  Plan as noted below.  Plan  Schizophrenia in remission Abilify 20 mg p.o. daily-reduced dosage.  Pending labs-TSH.  Patient was advised to get it done last visit-noncompliant.  Encouraged compliance.  Follow-up in clinic in 3 to 4 months or sooner in person.  This note was generated in part or whole with voice recognition software. Voice recognition is usually quite accurate but there are transcription errors that can and very often do occur. I apologize for any typographical errors that were not detected and corrected.       Jomarie Longs, MD 10/23/2021, 2:58 PM

## 2022-02-27 ENCOUNTER — Encounter: Payer: Self-pay | Admitting: Psychiatry

## 2022-02-27 ENCOUNTER — Ambulatory Visit (INDEPENDENT_AMBULATORY_CARE_PROVIDER_SITE_OTHER): Payer: Medicare Other | Admitting: Psychiatry

## 2022-02-27 VITALS — BP 118/76 | HR 80 | Temp 98.3°F | Wt 161.8 lb

## 2022-02-27 DIAGNOSIS — D696 Thrombocytopenia, unspecified: Secondary | ICD-10-CM | POA: Diagnosis not present

## 2022-02-27 DIAGNOSIS — F209 Schizophrenia, unspecified: Secondary | ICD-10-CM | POA: Diagnosis not present

## 2022-02-27 MED ORDER — ARIPIPRAZOLE 15 MG PO TABS: 15.0000 mg | ORAL_TABLET | Freq: Every day | ORAL | 0 refills | Status: DC

## 2022-02-27 NOTE — Progress Notes (Signed)
BH MD OP Progress Note  02/27/2022 2:08 PM Randall CarasDonald C Arif  MRN:  [id number]  Chief Complaint:  Chief Complaint  Patient presents with   Follow-up: 61 year old African-American male with history of schizophrenia, chronic thrombocytopenia with recent downtrending platelet count, presented for medication management.   HPI: Randall Simon is a 61 year old African-American male, single, lives in [place], has a history of schizophrenia was evaluated in office today.  Patient today appeared to be alert, oriented to person place time situation.  Was cooperative.  Was able to answer questions appropriately.  Patient currently reports mood symptoms are stable.  Denies any significant anxiety or depressive symptoms.  Denies perceptual disturbances.  As per mother who accompanied patient, patient is currently doing fairly well.  However per review of most recent labs-WBC downtrending 3.3 dated 12/27/2021, platelet count-104.  On 04/05/2021-platelet count was 122 and WBC count was within normal limits.  Patient [facility]Yvonne-per review of notes-dated 12/27/2021-thrombocytopenia likely due to Abilify.  We will continue to monitor this.  Patient as well as mother are agreeable to dosage reduction of Abilify.  Also agrees to get in touch with primary care provider to repeat labs.  Denies any other concerns today.  Visit Diagnosis:    ICD-10-CM   1. Schizophrenia in remission (HCC)  F20.9 ARIPiprazole (ABILIFY) 15 MG tablet    2. Thrombocytopenia (HCC)  D69.6    unspecified       Past Psychiatric History: Reviewed past psychiatric history from progress note on 10/20/2017.  Past Medical History:  Past Medical History:  Diagnosis Date   Chronic kidney disease    GERD (gastroesophageal reflux disease)    Head injury    Schizophrenia [facility](HCC)     Past Surgical History:  Procedure Laterality Date   head surgery      Family Psychiatric History:  Reviewed family psychiatric history from progress note on 10/20/2017.  Family History:  Family History  Problem Relation Age of Onset   Suicidality Mother    Heart attack Father     Social History: Reviewed social history from progress note on 10/20/2017. Social History   Socioeconomic History   Marital status: Single    Spouse name: Not on file   Number of children: 0   Years of education: Not on file   Highest education level: Not on file  Occupational History   Occupation: disabiled  Tobacco Use   Smoking status: Former   Smokeless tobacco: Never  Building services engineerVaping Use   Vaping Use: Never used  Substance and Sexual Activity   Alcohol use: No   Drug use: No   Sexual activity: Not Currently  Other Topics Concern   Not on file  Social History Narrative   Not on file   Social Determinants of Health   Financial Resource Strain: Not on file  Food Insecurity: Not on file  Transportation Needs: Not on file  Physical Activity: Not on file  Stress: Not on file  Social Connections: Not on file    Allergies: No Known Allergies  Metabolic Disorder Labs: Lab Results  Component Value Date   HGBA1C 5.6 12/28/2020   Lab Results  Component Value Date   PROLACTIN 2.4 (L) 12/28/2020   No results found for: CHOL, TRIG, HDL, CHOLHDL, VLDL, LDLCALC No results found for: TSH  Therapeutic Level Labs: No results found for: LITHIUM No results found for: VALPROATE No components found for:  CBMZ  Current Medications: Current Outpatient Medications  Medication  Sig Dispense Refill   albuterol (PROVENTIL HFA;VENTOLIN HFA) 108 (90 Base) MCG/ACT inhaler Inhale 2 puffs into the lungs every 6 (six) hours as needed for wheezing. 1 Inhaler 0   ARIPiprazole (ABILIFY) 15 MG tablet Take 1 tablet (15 mg total) by mouth daily. 90 tablet 0   ascorbic acid (VITAMIN C) 100 MG tablet Take by mouth.     doxycycline (VIBRAMYCIN) 100 MG capsule Take 1 capsule (100 mg total) by mouth 2 (two) times daily. 20  capsule 0   famotidine (PEPCID) 40 MG tablet Take 40 mg by mouth daily.     Multiple Vitamins-Minerals (ONCOVITE) TABS Take by mouth.     vitamin B-12 (CYANOCOBALAMIN) 1000 MCG tablet Take by mouth.     No current facility-administered medications for this visit.     Musculoskeletal: Strength & Muscle Tone: within normal limits Gait & Station: normal Patient leans: N/A  Psychiatric Specialty Exam: Review of Systems  Psychiatric/Behavioral:  Negative for agitation, behavioral problems, confusion, decreased concentration, dysphoric mood, hallucinations, self-injury, sleep disturbance and suicidal ideas. The patient is not nervous/anxious.   All other systems reviewed and are negative.  Blood pressure 118/76, pulse 80, temperature 98.3 F (36.8 C), temperature source Temporal, weight 161 lb 12.8 oz (73.4 kg).Body mass index is 23.43 kg/m.  General Appearance: Casual  Eye Contact:  Fair  Speech:  Clear and Coherent  Volume:  Decreased  Mood:  Euthymic  Affect:  Congruent  Thought Process:  Goal Directed and Descriptions of Associations: Intact  Orientation:  Full (Time, Place, and Person)  Thought Content: Logical   Suicidal Thoughts:  No  Homicidal Thoughts:  No  Memory:  Immediate;   Fair Recent;   Fair Remote;   Poor  Judgement:  Fair  Insight:  Fair  Psychomotor Activity:  Normal  Concentration:  Concentration: Fair and Attention Span: Fair  Recall:  [employer] of Knowledge: Fair  Language: Fair  Akathisia:  No  Handed:  Right  AIMS (if indicated): done  Assets:  Communication Skills Desire for Improvement Housing Social Support  ADL's:  Intact  Cognition: baseline  Sleep:  Fair   Screenings: AIMS    Flowsheet Row Office Visit from 02/27/2022 in [facility] Office Visit from 05/22/2021 in [facility] Office Visit from 06/23/2018 in [facility] Office Visit from 01/29/2018 in  [facility]  AIMS Total Score 0 0 0 0      GAD-7    Flowsheet Row Office Visit from 05/22/2021 in [facility]  Total GAD-7 Score 0      PHQ2-9    Flowsheet Row Office Visit from 10/23/2021 in [facility] Office Visit from 05/22/2021 in [facility] Office Visit from 12/20/2020 in [facility]  PHQ-2 Total Score 0 0 0  PHQ-9 Total Score -- 0 --      Flowsheet Row Office Visit from 05/22/2021 in [facility] Office Visit from 12/20/2020 in [facility]  C-SSRS RISK CATEGORY No Risk No Risk        Assessment and Plan: BENYAMIN JEFF is a 61 year old African-American male who has a history of schizophrenia, was evaluated in office today.  Patient is currently stable with regards to his mood, psychosis however does have thrombocytopenia, recent platelet counts downtrending, unknown if likely due to Abilify, currently under the care of a primary care provider.  Discussed plan as noted below.  Plan  Schizophrenia in remission Will reduce Abilify to 15 mg p.o. daily. Patient could take Abilify 15 mg and 20 mg on alternate days for the next 2 weeks and cut back to Abilify 15 mg p.o. daily after that.   Thrombocytopenia-unspecified-unstable Most recent labs showed platelet counts as downtrending dated 12/27/2021. Patient is currently under the care of primary care provider for the same.  We will reduce the Abilify dosage as noted above. Patient to contact primary care provider for monitoring and for further management of platelet counts.  Collateral information obtained from mother , discussed plan with mother who agrees with plan.  Follow-up in clinic in 4 weeks or sooner if needed.  This note was generated in part or whole with voice recognition software. Voice recognition is usually quite  accurate but there are transcription errors that can and very often do occur. I apologize for any typographical errors that were not detected and corrected.    Jomarie Longs, MD 02/28/2022, 12:53 PM

## 2022-03-28 ENCOUNTER — Ambulatory Visit (INDEPENDENT_AMBULATORY_CARE_PROVIDER_SITE_OTHER): Payer: Medicare Other | Admitting: Psychiatry

## 2022-03-28 ENCOUNTER — Encounter: Payer: Self-pay | Admitting: Psychiatry

## 2022-03-28 VITALS — BP 101/66 | HR 68 | Temp 97.9°F | Ht 70.0 in | Wt 160.0 lb

## 2022-03-28 DIAGNOSIS — D696 Thrombocytopenia, unspecified: Secondary | ICD-10-CM | POA: Diagnosis not present

## 2022-03-28 DIAGNOSIS — Z79899 Other long term (current) drug therapy: Secondary | ICD-10-CM

## 2022-03-28 DIAGNOSIS — F209 Schizophrenia, unspecified: Secondary | ICD-10-CM

## 2022-03-28 NOTE — Progress Notes (Signed)
BH MD OP Progress Note  03/28/2022 6:29 PM [name]  MRN:  [id number]  Chief Complaint:  Chief Complaint  Patient presents with   Follow-up: 61 year old African-American male with history of schizophrenia, chronic thrombocytopenia presented for medication management.   HPI: [name] is a 61 year old African-American male, single, lives in [place], has a history of schizophrenia was evaluated in office today.  Patient today reports mood symptoms are stable.  Denies any hallucinations, did not seem to be preoccupied with any delusions or paranoia.  Currently tolerating the lower dosage of Abilify well.  Patient reports he has not had any worsening mood symptoms or psychosis since being on the lower dosage.  Reports sleep as good.  Reports appetite is fair.  As per mother who was present in session patient continues to do well.  Currently compliant on medications.  Per mother she forgot to contact family medicine provided regarding monitoring his platelet count, recent low platelet count, and being on Abilify.  She agrees to do it today.  Denies any other concerns today.  Visit Diagnosis:    ICD-10-CM   1. 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 Thrombocytopenia (HCC)  D69.6     3. High risk medication use  Z79.899       Past Psychiatric History: Reviewed past psychiatric history from progress note on 10/20/2017.  Past Medical History:  Past Medical History:  Diagnosis Date   Chronic kidney disease    GERD (gastroesophageal reflux disease)    Head injury    Schizophrenia [facility])     Past Surgical History:  Procedure Laterality Date   head surgery      Family Psychiatric History: Reviewed family psychiatric history from progress note on 10/20/2017.  Family History:  Family History  Problem Relation Age of Onset   Suicidality Mother    Heart attack Father     Social History: Reviewed social history from progress note on 10/20/2017. Social History    Socioeconomic History   Marital status: Single    Spouse name: Not on file   Number of children: 0   Years of education: Not on file   Highest education level: Not on file  Occupational History   Occupation: disabiled  Tobacco Use   Smoking status: Former   Smokeless tobacco: Never  Vaping Use   Vaping Use: Never used  Substance and Sexual Activity   Alcohol use: No   Drug use: No   Sexual activity: Not Currently  Other Topics Concern   Not on file  Social History Narrative   Not on file   Social Determinants of Health   Financial Resource Strain: Low Risk  (08/18/2017)   Overall Financial Resource Strain (CARDIA)    Difficulty of Paying Living Expenses: Not very hard  Food Insecurity: No Food Insecurity (08/18/2017)   Hunger Vital Sign    Worried About Running Out of Food in the Last Year: Never true    Ran Out of Food in the Last Year: Never true  Transportation Needs: No Transportation Needs (08/18/2017)   PRAPARE - Administrator, Civil Service (Medical): No    Lack of Transportation (Non-Medical): No  Physical Activity: Not on file  Stress: No Stress Concern Present (08/18/2017)   [employer] of Occupational Health - Occupational Stress Questionnaire    Feeling of Stress : Not at all  Social Connections: Unknown (08/18/2017)   Social Connection and Isolation Panel [NHANES]    Frequency  of Communication with Friends and Family: Once a week    Frequency of Social Gatherings with Friends and Family: Once a week    Attends Religious Services: Patient refused    Database administrator or Organizations: No    Attends Engineer, structural: Never    Marital Status: Never married    Allergies: No Known Allergies  Metabolic Disorder Labs: Lab Results  Component Value Date   HGBA1C 5.6 12/28/2020   Lab Results  Component Value Date   PROLACTIN 2.4 (L) 12/28/2020   No results found for: "CHOL", "TRIG", "HDL", "CHOLHDL", "VLDL",  "LDLCALC" No results found for: "TSH"  Therapeutic Level Labs: No results found for: "LITHIUM" No results found for: "VALPROATE" No results found for: "CBMZ"  Current Medications: Current Outpatient Medications  Medication Sig Dispense Refill   albuterol (PROVENTIL HFA;VENTOLIN HFA) 108 (90 Base) MCG/ACT inhaler Inhale 2 puffs into the lungs every 6 (six) hours as needed for wheezing. 1 Inhaler 0   ARIPiprazole (ABILIFY) 15 MG tablet Take 1 tablet (15 mg total) by mouth daily. 90 tablet 0   ascorbic acid (VITAMIN C) 100 MG tablet Take by mouth.     famotidine (PEPCID) 40 MG tablet Take 40 mg by mouth daily.     Multiple Vitamins-Minerals (ONCOVITE) TABS Take by mouth.     vitamin B-12 (CYANOCOBALAMIN) 1000 MCG tablet Take by mouth.     No current facility-administered medications for this visit.     Musculoskeletal: Strength & Muscle Tone: within normal limits Gait & Station: normal Patient leans: N/A  Psychiatric Specialty Exam: Review of Systems  Psychiatric/Behavioral: Negative.    All other systems reviewed and are negative.   Blood pressure 101/66, pulse 68, temperature 97.9 F (36.6 C), height 5\' 10"  (1.778 m), weight 160 lb (72.6 kg), SpO2 99 %.Body mass index is 22.96 kg/m.  General Appearance: Casual  Eye Contact:  Fair  Speech:  Clear and Coherent  Volume:  Normal  Mood:  Euthymic  Affect:  Congruent  Thought Process:  Goal Directed and Descriptions of Associations: Intact  Orientation:  Full (Time, Place, and Person)  Thought Content: Logical   Suicidal Thoughts:  No  Homicidal Thoughts:  No  Memory:  Immediate;   Fair Recent;   Fair Remote;   Poor  Judgement:  Fair  Insight:  Shallow  Psychomotor Activity:  Normal  Concentration:  Concentration: Fair and Attention Span: Fair  Recall:  of Knowledge: Fair  Language: Fair  Akathisia:  No  Handed:  Right  AIMS (if indicated): done  Assets:  Communication Skills Desire for  Improvement Housing Social Support  ADL's:  Intact  Cognition: WNL  Sleep:  Fair   Screenings: [employer] Office Visit from 03/28/2022 in [facility] Office Visit from 02/27/2022 in [facility] Office Visit from 05/22/2021 in [facility] Office Visit from 06/23/2018 in [facility] Office Visit from 01/29/2018 in [facility]  AIMS Total Score 0 0 0 0 0      GAD-7    Flowsheet Row Office Visit from 05/22/2021 in [facility]  Total GAD-7 Score 0      PHQ2-9    Flowsheet Row Office Visit from 03/28/2022 in [facility] Office Visit from 10/23/2021 in [facility] Office Visit from 05/22/2021 in [facility] Office Visit from 12/20/2020 in [facility]  PHQ-2 Total Score 0 0 0 0  PHQ-9 Total Score 0 -- 0 --      Flowsheet Row Office Visit from 03/28/2022 in [facility] Office Visit from 05/22/2021 in [facility] Office Visit from 12/20/2020 in [facility]  C-SSRS RISK CATEGORY No Risk No Risk No Risk        Assessment and Plan: Randall Simon is a 61 year old African-American male who has a history of schizophrenia was evaluated in office today.  Patient is currently stable on the lower dose of Abilify however will need monitoring of his thrombocytopenia given the fact that he is on Abilify.  Discussed plan as noted below.  Plan Schizophrenia in remission Continue reduced Abilify 15 mg p.o. daily dosage.  Thrombocytopenia-unspecified-unstable Patient to contact his family provider for monitoring.  Collateral information obtained from mother.  Follow-up in clinic in 3 months or sooner if needed.   This note was  generated in part or whole with voice recognition software. Voice recognition is usually quite accurate but there are transcription errors that can and very often do occur. I apologize for any typographical errors that were not detected and corrected.     Jomarie Longs, MD 03/28/2022, 6:29 PM

## 2022-03-28 NOTE — Patient Instructions (Addendum)
Please follow up with Dr.Luyando for your low Platelets .

## 2022-03-28 NOTE — Progress Notes (Deleted)
BH MD OP Progress Note  03/28/2022 1:49 PM Randall Simon  MRN:  [id number]  Chief Complaint:  Chief Complaint  Patient presents with   Follow-up   HPI: *** Visit Diagnosis: No diagnosis found.  Past Psychiatric History: ***  Past Medical History:  Past Medical History:  Diagnosis Date   Chronic kidney disease    GERD (gastroesophageal reflux disease)    Head injury    Schizophrenia (HCC)     Past Surgical History:  Procedure Laterality Date   head surgery      Family Psychiatric History: ***  Family History:  Family History  Problem Relation Age of Onset   Suicidality Mother    Heart attack Father     Social History:  Social History   Socioeconomic History   Marital status: Single    Spouse name: Not on file   Number of children: 0   Years of education: Not on file   Highest education level: Not on file  Occupational History   Occupation: disabiled  Tobacco Use   Smoking status: Former   Smokeless tobacco: Never  Building services engineer Use: Never used  Substance and Sexual Activity   Alcohol use: No   Drug use: No   Sexual activity: Not Currently  Other Topics Concern   Not on file  Social History Narrative   Not on file   Social Determinants of Health   Financial Resource Strain: Low Risk  (08/18/2017)   Overall Financial Resource Strain (CARDIA)    Difficulty of Paying Living Expenses: Not very hard  Food Insecurity: No Food Insecurity (08/18/2017)   Hunger Vital Sign    Worried About Running Out of Food in the Last Year: Never true    Ran Out of Food in the Last Year: Never true  Transportation Needs: No Transportation Needs (08/18/2017)   PRAPARE - Administrator, Civil Service (Medical): No    Lack of Transportation (Non-Medical): No  Physical Activity: Not on file  Stress: No Stress Concern Present (08/18/2017)   [employer] of Occupational Health - Occupational Stress Questionnaire    Feeling of Stress : Not at all   Social Connections: Unknown (08/18/2017)   Social Connection and Isolation Panel [NHANES]    Frequency of Communication with Friends and Family: Once a week    Frequency of Social Gatherings with Friends and Family: Once a week    Attends Religious Services: Patient refused    Database administrator or Organizations: No    Attends Engineer, structural: Never    Marital Status: Never married    Allergies: No Known Allergies  Metabolic Disorder Labs: Lab Results  Component Value Date   HGBA1C 5.6 12/28/2020   Lab Results  Component Value Date   PROLACTIN 2.4 (L) 12/28/2020   No results found for: "CHOL", "TRIG", "HDL", "CHOLHDL", "VLDL", "LDLCALC" No results found for: "TSH"  Therapeutic Level Labs: No results found for: "LITHIUM" No results found for: "VALPROATE" No results found for: "CBMZ"  Current Medications: Current Outpatient Medications  Medication Sig Dispense Refill   albuterol (PROVENTIL HFA;VENTOLIN HFA) 108 (90 Base) MCG/ACT inhaler Inhale 2 puffs into the lungs every 6 (six) hours as needed for wheezing. 1 Inhaler 0   ARIPiprazole (ABILIFY) 15 MG tablet Take 1 tablet (15 mg total) by mouth daily. 90 tablet 0   ascorbic acid (VITAMIN C) 100 MG tablet Take by mouth.     doxycycline (VIBRAMYCIN) 100 MG capsule Take  1 capsule (100 mg total) by mouth 2 (two) times daily. 20 capsule 0   famotidine (PEPCID) 40 MG tablet Take 40 mg by mouth daily.     Multiple Vitamins-Minerals (ONCOVITE) TABS Take by mouth.     vitamin B-12 (CYANOCOBALAMIN) 1000 MCG tablet Take by mouth.     No current facility-administered medications for this visit.     Musculoskeletal: Strength & Muscle Tone: {desc; muscle tone:32375} Gait & Station: {PE GAIT ED QQIW:97989} Patient leans: {Patient Leans:21022755}  Psychiatric Specialty Exam: Review of Systems  There were no vitals taken for this visit.There is no height or weight on file to calculate BMI.  General Appearance:  {Appearance:22683}  Eye Contact:  {BHH EYE CONTACT:22684}  Speech:  {Speech:22685}  Volume:  {Volume (PAA):22686}  Mood:  {BHH MOOD:22306}  Affect:  {Affect (PAA):22687}  Thought Process:  {Thought Process (PAA):22688}  Orientation:  {BHH ORIENTATION (PAA):22689}  Thought Content: {Thought Content:22690}   Suicidal Thoughts:  {ST/HT (PAA):22692}  Homicidal Thoughts:  {ST/HT (PAA):22692}  Memory:  {BHH MEMORY:22881}  Judgement:  {Judgement (PAA):22694}  Insight:  {Insight (PAA):22695}  Psychomotor Activity:  {Psychomotor (PAA):22696}  Concentration:  {Concentration:21399}  Recall:  {BHH GOOD/FAIR/POOR:22877}  Fund of Knowledge: {BHH GOOD/FAIR/POOR:22877}  Language: {BHH GOOD/FAIR/POOR:22877}  Akathisia:  {BHH YES OR NO:22294}  Handed:  {Handed:22697}  AIMS (if indicated): {Desc; done/not:10129}  Assets:  {Assets (PAA):22698}  ADL's:  {BHH QJJ'H:41740}  Cognition: {chl bhh cognition:304700322}  Sleep:  {BHH GOOD/FAIR/POOR:22877}   Screenings: AIMS    Flowsheet Row Office Visit from 02/27/2022 in [facility] Office Visit from 05/22/2021 in [facility] Office Visit from 06/23/2018 in [facility] Office Visit from 01/29/2018 in [facility]  AIMS Total Score 0 0 0 0      GAD-7    Flowsheet Row Office Visit from 05/22/2021 in [facility]  Total GAD-7 Score 0      PHQ2-9    Flowsheet Row Office Visit from 10/23/2021 in [facility] Office Visit from 05/22/2021 in [facility] Office Visit from 12/20/2020 in [facility]  PHQ-2 Total Score 0 0 0  PHQ-9 Total Score -- 0 --      Flowsheet Row Office Visit from 05/22/2021 in [facility] Office Visit from 12/20/2020 in [facility]  C-SSRS RISK CATEGORY No Risk No  Risk        Assessment and Plan: ***  Collaboration of Care: Collaboration of Care: [facility] OP Collaboration of CXKG:[id number]}  Patient/Guardian was advised Release of Information must be obtained prior to any record release in order to collaborate their care with an outside provider. Patient/Guardian was advised if they have not already done so to contact the registration department to sign all necessary forms in order for [place] to release information regarding their care.   Consent: Patient/Guardian gives verbal consent for treatment and assignment of benefits for services provided during this visit. Patient/Guardian expressed understanding and agreed to proceed.    Jomarie Longs, MD 03/28/2022, 1:49 PM

## 2022-06-03 ENCOUNTER — Telehealth: Payer: Self-pay

## 2022-06-03 DIAGNOSIS — F209 Schizophrenia, unspecified: Secondary | ICD-10-CM

## 2022-06-03 MED ORDER — ARIPIPRAZOLE 15 MG PO TABS: 15.0000 mg | ORAL_TABLET | Freq: Every day | ORAL | 1 refills | Status: DC

## 2022-06-03 NOTE — Telephone Encounter (Signed)
I have sent Abilify 15 mg to pharmacy.  [facility].

## 2022-06-03 NOTE — Telephone Encounter (Signed)
Pt mother called left message that he needed a refill on the abilify.  Pt last seen on  03-28-22 next appt 06-28-22   Outpatient Medication Detail   Disp Refills Start End   ARIPiprazole (ABILIFY) 15 MG tablet 90 tablet 0 02/27/2022    Sig - Route: Take 1 tablet (15 mg total) by mouth daily. - Oral   Sent to pharmacy as: ARIPiprazole (ABILIFY) 15 MG tablet   Notes to Pharmacy: Dose change - stop Abilify 20 mg   E-Prescribing Status: Receipt confirmed by pharmacy (02/27/2022 11:27 AM EDT)

## 2022-06-04 NOTE — Telephone Encounter (Signed)
left message that medication was sent to the pharmacy  

## 2022-06-21 ENCOUNTER — Ambulatory Visit
Admission: EM | Admit: 2022-06-21 | Discharge: 2022-06-21 | Disposition: A | Discharge status: Home / Self Care | Payer: Medicare Other | Attending: Family Medicine | Admitting: Family Medicine

## 2022-06-21 DIAGNOSIS — J069 Acute upper respiratory infection, unspecified: Secondary | ICD-10-CM | POA: Diagnosis present

## 2022-06-21 DIAGNOSIS — U071 COVID-19: Secondary | ICD-10-CM | POA: Diagnosis not present

## 2022-06-21 LAB — SARS CORONAVIRUS 2 BY RT PCR: SARS Coronavirus 2 by RT PCR: POSITIVE — AB

## 2022-06-21 NOTE — ED Provider Notes (Signed)
[facility]    CSN: [id number] Arrival date & time: 06/21/22  1240      History   Chief Complaint Chief Complaint  Patient presents with   Fever   Chills   Nasal Congestion    HPI Randall Simon is a 61 y.o. male.   HPI  History provided by patient's mom and the patient.  Randall Simon presents for fever with rhinorrhea and nasal congestion that started 2 days ago.  Mom reports fever of 103 F yesterday.  He has a nonproductive cough.  No sore throat, ear pain, nausea, vomiting, diarrhea or headache.  He has been sleeping okay.  No rash.  He has been trying some over-the-counter cough medicine without relief.  No one else at home has similar symptoms.  He has never had COVID before.  He is COVID vaccinated and boosted x1.      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 Authorizing Provider  albuterol (PROVENTIL HFA;VENTOLIN HFA) 108 (90 Base) MCG/ACT inhaler Inhale 2 puffs into the lungs every 6 (six) hours as needed for wheezing. 01/12/18  Yes Renford Dills, NP  ARIPiprazole (ABILIFY) 15 MG tablet Take 1 tablet (15 mg total) by mouth daily. 06/03/22  Yes  Jomarie Longs, MD  ascorbic acid (VITAMIN C) 100 MG tablet Take by mouth.   Yes [provider]  famotidine (PEPCID) 40 MG tablet Take 40 mg by mouth daily.   Yes [provider]  Multiple Vitamins-Minerals (ONCOVITE) TABS Take by mouth.   Yes [provider]  vitamin B-12 (CYANOCOBALAMIN) 1000 MCG tablet Take by mouth.   Yes [provider]    Family History Family History  Problem Relation Age of Onset   Suicidality Mother    Heart attack Father     Social History Social History   Tobacco Use   Smoking status: Former   Smokeless tobacco: Never  Building services engineer Use: Never used  Substance Use Topics   Alcohol use: No   Drug use: No     Allergies   Patient has no known allergies.   Review of Systems Review of Systems: negative unless otherwise stated in HPI.      Physical Exam Triage Vital Signs ED Triage Vitals  Enc Vitals Group     BP 06/21/22 1259 102/64     Pulse Rate 06/21/22 1259 95     Resp --      Temp 06/21/22 1259 99 F (37.2 C)     Temp Source 06/21/22 1259 Oral     SpO2 06/21/22 1259 96 %     Weight  06/21/22 1257 159 lb (72.1 kg)     Height 06/21/22 1257 5\' 9"  (1.753 m)     Head Circumference --      Peak Flow --      Pain Score 06/21/22 1257 0     Pain Loc --      Pain Edu? --      Excl. in GC? --    No data found.  Updated Vital Signs BP 102/64 (BP Location: Left Arm)   Pulse 95   Temp 99 F (37.2 C) (Oral)   Ht 5\' 9"  (1.753 m)   Wt 72.1 kg   SpO2 96%   BMI 23.48 kg/m   Visual Acuity Right Eye Distance:   Left Eye Distance:   Bilateral Distance:    Right Eye Near:   Left Eye Near:    Bilateral Near:     Physical Exam GEN:     alert, non-toxic appearing male in no distress    HENT:  mucus membranes moist, oropharyngeal without erythema or exudate, no tonsillar hypertrophy,  moderate erythematous hypertrophied turbinates, clear nasal discharge, left TM obscured by cerumen, right TM  normal, non-tender erythematous lesion on left tongue (see image under media) EYES:   pupils equal and reactive, EOMi, no scleral injection NECK:  normal ROM, no lymphadenopathy, no meningismus   RESP:  no increased work of breathing, clear to auscultation bilaterally CVS:   regular rate and rhythm Skin:   warm and dry, no rash on visible skin, normal skin turgor    UC Treatments / Results  Labs (all labs ordered are listed, but only abnormal results are displayed) Labs Reviewed  SARS CORONAVIRUS 2 BY RT PCR - Abnormal; Notable for the following components:      Result Value   SARS Coronavirus 2 by RT PCR POSITIVE (*)    All other components within normal limits    EKG   Radiology No results found.  Procedures Procedures (including critical care time)  Medications Ordered in UC Medications - No data to display  Initial Impression / Assessment and Plan / UC Course  I have reviewed the triage vital signs and the nursing notes.  Pertinent labs & imaging results that were available during my care of the patient were reviewed by me and considered in my medical decision making (see chart for details).       Pt is a 61 y.o. male who presents for 2 days of respiratory symptoms. Randall Simon is afebrile here without recent antipyretics. Satting well on room air. Overall pt is well appearing, well hydrated, without respiratory distress. Pulmonary exam is unremarkable, imaging deferred. COVID testing obtained. History consistent with viral respiratory illness. Discussed symptomatic treatment. Explained lack of efficacy of antibiotics in viral disease.    COVID test returned positive.  Mom called and updated.  He is outside of the window for COVID treatment.  Quarantine instructions provided. Work note offered but has not needed.  Quarantine instructions provided.  ED and return precautions and understanding voiced. Discussed MDM, treatment plan and plan for follow-up with patient/parent  who agrees with plan.      Final Clinical Impressions(s) / UC Diagnoses   Final diagnoses:  Upper respiratory tract infection, unspecified type     Discharge Instructions      We will contact you if your COVID test is positive.  Please quarantine while you wait for the results.  If your test is negative you may resume normal activities.  If your test is  positive please continue to quarantine for at least 5 days from your symptom onset or until you are without a fever for at least 24 hours after the medications.   You can take Tylenol and/or Ibuprofen as needed for fever reduction and pain relief.    For cough: honey 1/2 to 1 teaspoon (you can dilute the honey in water or another fluid).  You can also use guaifenesin and dextromethorphan for cough. You can use a humidifier for chest congestion and cough.  If you don't have a humidifier, you can sit in the bathroom with the hot shower running.      For sore throat: try warm salt water gargles, cepacol lozenges, throat spray, warm tea or water with lemon/honey, popsicles or ice, or OTC cold relief medicine for throat discomfort.    For congestion: take a daily anti-histamine like Zyrtec, Claritin, and a oral decongestant, such as pseudoephedrine.  You can also use Flonase 1-2 sprays in each nostril daily.    It is important to stay hydrated: drink plenty of fluids (water, gatorade/powerade/pedialyte, juices, or teas) to keep your throat moisturized and help further relieve irritation/discomfort.    Return or go to the Emergency Department if symptoms worsen or do not improve in the next few days  Use 1 capful of peroxide in the left ear.  Stop by the pharmacy to pick up Debrox for earwax removal.    Take Care,   Dr Susa Simmonds      ED Prescriptions   None    PDMP not reviewed this encounter.   Lyndee Hensen, DO 06/21/22 1450

## 2022-06-21 NOTE — Discharge Instructions (Addendum)
We will contact you if your COVID test is positive.  Please quarantine while you wait for the results.  If your test is negative you may resume normal activities.  If your test is positive please continue to quarantine for at least 5 days from your symptom onset or until you are without a fever for at least 24 hours after the medications.   You can take Tylenol and/or Ibuprofen as needed for fever reduction and pain relief.    For cough: honey 1/2 to 1 teaspoon (you can dilute the honey in water or another fluid).  You can also use guaifenesin and dextromethorphan for cough. You can use a humidifier for chest congestion and cough.  If you don't have a humidifier, you can sit in the bathroom with the hot shower running.      For sore throat: try warm salt water gargles, cepacol lozenges, throat spray, warm tea or water with lemon/honey, popsicles or ice, or OTC cold relief medicine for throat discomfort.    For congestion: take a daily anti-histamine like Zyrtec, Claritin, and a oral decongestant, such as pseudoephedrine.  You can also use Flonase 1-2 sprays in each nostril daily.    It is important to stay hydrated: drink plenty of fluids (water, gatorade/powerade/pedialyte, juices, or teas) to keep your throat moisturized and help further relieve irritation/discomfort.    Return or go to the Emergency Department if symptoms worsen or do not improve in the next few days  Use 1 capful of peroxide in the left ear.  Stop by the pharmacy to pick up Debrox for earwax removal.    Take Care,   Dr Rachael Darby

## 2022-06-21 NOTE — ED Triage Notes (Signed)
Pt c/o chills, fever, runny nose onset x2 days ago

## 2022-06-28 ENCOUNTER — Ambulatory Visit: Payer: Medicare Other | Admitting: Psychiatry

## 2022-07-08 ENCOUNTER — Ambulatory Visit (INDEPENDENT_AMBULATORY_CARE_PROVIDER_SITE_OTHER): Payer: Medicare Other | Admitting: Psychiatry

## 2022-07-08 ENCOUNTER — Encounter: Payer: Self-pay | Admitting: Psychiatry

## 2022-07-08 VITALS — BP 109/69 | HR 85 | Temp 98.5°F | Wt 155.0 lb

## 2022-07-08 DIAGNOSIS — F209 Schizophrenia, unspecified: Secondary | ICD-10-CM | POA: Diagnosis not present

## 2022-07-08 NOTE — Progress Notes (Signed)
[facility] MD OP Progress Note  07/08/2022 2:58 PM Randall Simon  MRN:  [id number]  Chief Complaint:  Chief Complaint  Patient presents with   Follow-up   Medication Refill   HPI: Randall Simon is a 61 year old African-American male, single, lives in [place], has a history of schizophrenia, CKD stage III was evaluated in office today.  Patient as well as mother participated in the evaluation today.  Patient appeared to be alert, oriented to person place time situation.  Did not appear to have any paranoia, did not appear to be preoccupied with any delusions.  Denies any hallucinations.  Patient denies any significant mood symptoms, denies depression, anxiety.  Reports sleep is overall good.  Patient was able to tell the date, the month , day, year, correctly.  Patient was able to spell the word 'WORLD', forward and backward correctly. 3 word memory - 3/3, immediately and after 5 minutes 2 out of 3.  Patient could not do calculation.  Per mother patient had COVID-19 infection recently however is currently doing well.  Per mother patient is currently doing well on the Abilify, compliant on the dosage.      Visit Diagnosis:    ICD-10-CM   1. 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  Past Medical History:  Past Medical History:  Diagnosis Date   Chronic kidney disease    GERD (gastroesophageal reflux disease)    Head injury    Schizophrenia [facility])     Past Surgical History:  Procedure Laterality Date   head surgery      Family Psychiatric History: Reviewed family psychiatric history from progress note on 10/20/2017.  Family History:  Family History  Problem Relation Age of Onset   Suicidality Mother    Heart attack Father     Social History: Reviewed social history from progress note on 10/20/2017. Social History   Socioeconomic History   Marital status: Single     Spouse name: Not on file   Number of children: 0   Years of education: Not on file   Highest education level: Not on file  Occupational History   Occupation: disabiled  Tobacco Use   Smoking status: Former   Smokeless tobacco: Never  Vaping Use   Vaping Use: Never used  Substance and Sexual Activity   Alcohol use: No   Drug use: No   Sexual activity: Not Currently  Other Topics Concern   Not on file  Social History Narrative   Not on file   Social Determinants of Health   Financial Resource Strain: Low Risk  (08/18/2017)   Overall Financial Resource Strain (CARDIA)    Difficulty of Paying Living Expenses: Not very hard  Food Insecurity: No Food Insecurity (08/18/2017)   Hunger Vital Sign    Worried About Running Out of Food in the Last Year: Never true    Ran Out of Food in the Last Year: Never true  Transportation Needs: No Transportation Needs (08/18/2017)   PRAPARE - Administrator, Civil Service (Medical): No    Lack of Transportation (Non-Medical): No  Physical Activity: Not on file  Stress: No Stress Concern Present (08/18/2017)   [employer] of Occupational Health - Occupational Stress Questionnaire    Feeling of Stress : Not at all  Social Connections: Unknown (08/18/2017)   Social Connection and Isolation Panel [NHANES]    Frequency of Communication with  Friends and Family: Once a week    Frequency of Social Gatherings with Friends and Family: Once a week    Attends Religious Services: Patient refused    Marine scientist or Organizations: No    Attends Music therapist: Never    Marital Status: Never married    Allergies: No Known Allergies  Metabolic Disorder Labs: Lab Results  Component Value Date   HGBA1C 5.6 12/28/2020   Lab Results  Component Value Date   PROLACTIN 2.4 (L) 12/28/2020   No results found for: "CHOL", "TRIG", "HDL", "CHOLHDL", "VLDL", "LDLCALC" No results found for: "TSH"  Therapeutic Level  Labs: No results found for: "LITHIUM" No results found for: "VALPROATE" No results found for: "CBMZ"  Current Medications: Current Outpatient Medications  Medication Sig Dispense Refill   ARIPiprazole (ABILIFY) 15 MG tablet Take 1 tablet (15 mg total) by mouth daily. 90 tablet 1   ascorbic acid (VITAMIN C) 100 MG tablet Take by mouth.     famotidine (PEPCID) 40 MG tablet Take 40 mg by mouth daily.     Multiple Vitamins-Minerals (ONCOVITE) TABS Take by mouth.     vitamin B-12 (CYANOCOBALAMIN) 1000 MCG tablet Take by mouth.     albuterol (PROVENTIL HFA;VENTOLIN HFA) 108 (90 Base) MCG/ACT inhaler Inhale 2 puffs into the lungs every 6 (six) hours as needed for wheezing. (Patient not taking: Reported on 07/08/2022) 1 Inhaler 0   No current facility-administered medications for this visit.     Musculoskeletal: Strength & Muscle Tone: within normal limits Gait & Station: normal Patient leans: N/A  Psychiatric Specialty Exam: Review of Systems  Psychiatric/Behavioral: Negative.    All other systems reviewed and are negative.   Blood pressure 109/69, pulse 85, temperature 98.5 F (36.9 C), temperature source Temporal, weight 155 lb (70.3 kg).Body mass index is 22.89 kg/m.  General Appearance: Casual  Eye Contact:  Minimal  Speech:  Normal Rate  Volume:  Normal  Mood:  Euthymic  Affect:  Restricted  Thought Process:  Goal Directed and Descriptions of Associations: Intact  Orientation:  Full (Time, Place, and Person)  Thought Content: Logical   Suicidal Thoughts:  No  Homicidal Thoughts:  No  Memory:  Immediate;   Fair Recent;   Fair Remote;   Limited  Judgement:  Fair  Insight:  Shallow  Psychomotor Activity:  Normal  Concentration:  Concentration: Fair and Attention Span: Fair  Recall:  [employer] of Knowledge: Fair  Language: Fair  Akathisia:  No  Handed:  Right  AIMS (if indicated): done  Assets:  Communication Skills Desire for Improvement Housing Social  Support Transportation  ADL's:  Intact  Cognition: WNL  Sleep:  Fair   Screenings: Administrator, Civil Service Office Visit from 07/08/2022 in [place] Office Visit from 03/28/2022 in [place] Visit from 02/27/2022 in [place] Visit from 05/22/2021 in [place] Visit from 06/23/2018 in [place] Total Score 0 0 0 0 0      [place] Visit from 07/08/2022 in [place] Visit from 05/22/2021 in [place]  Total GAD-7 Score 0 0      PHQ2-9    [place] Visit from 07/08/2022 in [place] Visit from 03/28/2022 in [place] Visit from 10/23/2021 in [place]  Visit from 05/22/2021 in [facility] Office Visit from 12/20/2020 in [facility]  PHQ-2 Total Score 0 0 0 0 0  PHQ-9 Total Score 0 0 -- 0 --      Flowsheet Row Office Visit from 07/08/2022 in [facility] ED from 06/21/2022 in [facility] Visit from 03/28/2022 in [facility]  C-SSRS RISK CATEGORY No Risk No Risk No Risk        Assessment and Plan: Randall Simon is a 61 year old African-American male, has a history of schizophrenia was evaluated in office today.  Patient is currently stable.  Patient with history of kidney disease, thrombocytopenia, advised to follow up with primary care provider.  Plan Schizophrenia-in remission Continue Abilify 15 mg p.o. daily which is currently keeping him stable.  Collateral information obtained from mother as noted above.  Patient to follow up with primary care provider for  repeat CBC-following his platelet count which has been low.  Follow-up in clinic in 3 to 4 months or sooner if needed.  This note was generated in part or whole with voice recognition software. Voice recognition is usually quite accurate but there are transcription errors that can and very often do occur. I apologize for any typographical errors that were not detected and corrected.      Jomarie Longs, MD 07/08/2022, 2:58 PM

## 2022-07-08 NOTE — Patient Instructions (Signed)
Please talk to Primary care provider about your Platelet count being low.

## 2022-11-06 ENCOUNTER — Ambulatory Visit: Payer: 59 | Admitting: Psychiatry

## 2022-11-28 ENCOUNTER — Other Ambulatory Visit: Payer: Self-pay | Admitting: Psychiatry

## 2022-11-28 DIAGNOSIS — F209 Schizophrenia, unspecified: Secondary | ICD-10-CM

## 2022-12-18 ENCOUNTER — Ambulatory Visit (INDEPENDENT_AMBULATORY_CARE_PROVIDER_SITE_OTHER): Payer: 59 | Admitting: Psychiatry

## 2022-12-18 ENCOUNTER — Encounter: Payer: Self-pay | Admitting: Psychiatry

## 2022-12-18 VITALS — BP 117/76 | HR 97 | Temp 98.9°F | Ht 69.0 in | Wt 163.2 lb

## 2022-12-18 DIAGNOSIS — F209 Schizophrenia, unspecified: Secondary | ICD-10-CM

## 2022-12-18 NOTE — Progress Notes (Signed)
[facility] MD OP Progress Note  12/18/2022 10:25 AM CLARION TRASK  MRN:  [id number]  Chief Complaint:  Chief Complaint  Patient presents with   Follow-up   Medication Refill   Schizophrenia   HPI: Randall Simon is a 62 year old African-American male, single, lives in [place], has a history of schizophrenia, chronic renal disease stage III was evaluated in office today.  Patient presented along with sister Randall Simon, who provided collateral information.  Patient today answered questions in short phrases.  Reports everything is going well.  Was able to state the date correctly.  Patient was able to do serial threes correctly.  Patient with 3 word memory immediate 3 out of 3 and after 5 minutes 3 out of 3.  Patient did not appear to be responding to internal stimuli.  Did not appear to be preoccupied with any delusions.  Patient denied any suicidality, homicidality or perceptual disturbances.  Patient denies any anxiety or depression symptoms.  Reports appetite is fair.  Patient currently compliant on medication, denies side effects.  Per sister, patient currently does not have a primary care provider, however they want to make sure and is planning to reach out.  Patient also continues to follow-up with nephrology.  Has upcoming appointment likely in June.  Visit Diagnosis:    ICD-10-CM   1. Schizophrenia in remission ([place])  F20.9       Past Psychiatric History: I have reviewed past psychiatric history from progress note on 10/20/2017.  Past Medical History:  Past Medical History:  Diagnosis Date   Chronic kidney disease    GERD (gastroesophageal reflux disease)    Head injury    Schizophrenia [facility])     Past Surgical History:  Procedure Laterality Date   head surgery      Family Psychiatric History: I have reviewed family psychiatric history from progress note on 10/20/2017.  Family History:  Family History  Problem Relation Age of Onset   Suicidality Mother    Heart attack  Father     Social History: I have reviewed social history from progress note on 10/20/2017. Social History   Socioeconomic History   Marital status: Single    Spouse name: Not on file   Number of children: 0   Years of education: Not on file   Highest education level: Not on file  Occupational History   Occupation: disabiled  Tobacco Use   Smoking status: Former   Smokeless tobacco: Never  Vaping Use   Vaping Use: Never used  Substance and Sexual Activity   Alcohol use: No   Drug use: No   Sexual activity: Not Currently  Other Topics Concern   Not on file  Social History Narrative   Not on file   Social Determinants of Health   Financial Resource Strain: Low Risk  (08/18/2017)   Overall Financial Resource Strain (CARDIA)    Difficulty of Paying Living Expenses: Not very hard  Food Insecurity: No Food Insecurity (08/18/2017)   Hunger Vital Sign    Worried About Running Out of Food in the Last Year: Never true    Ran Out of Food in the Last Year: Never true  Transportation Needs: No Transportation Needs (08/18/2017)   PRAPARE - Hydrologist (Medical): No    Lack of Transportation (Non-Medical): No  Physical Activity: Not on file  Stress: No Stress Concern Present (08/18/2017)   [place]    Feeling of Stress :  Not at all  Social Connections: Unknown (08/18/2017)   Social Connection and Isolation Panel [NHANES]    Frequency of Communication with Friends and Family: Once a week    Frequency of Social Gatherings with Friends and Family: Once a week    Attends Religious Services: Patient refused    Marine scientist or Organizations: No    Attends Archivist Meetings: Never    Marital Status: Never married    Allergies: No Known Allergies  Metabolic Disorder Labs: Lab Results  Component Value Date   HGBA1C 5.6 12/28/2020   Lab Results  Component Value Date    PROLACTIN 2.4 (L) 12/28/2020   No results found for: "CHOL", "TRIG", "HDL", "CHOLHDL", "VLDL", "LDLCALC" No results found for: "TSH"  Therapeutic Level Labs: No results found for: "LITHIUM" No results found for: "VALPROATE" No results found for: "CBMZ"  Current Medications: Current Outpatient Medications  Medication Sig Dispense Refill   albuterol (PROVENTIL HFA;VENTOLIN HFA) 108 (90 Base) MCG/ACT inhaler Inhale 2 puffs into the lungs every 6 (six) hours as needed for wheezing. 1 Inhaler 0   ARIPiprazole (ABILIFY) 15 MG tablet TAKE 1 TABLET (15 MG TOTAL) BY MOUTH DAILY. 90 tablet 1   ascorbic acid (VITAMIN C) 100 MG tablet Take by mouth.     famotidine (PEPCID) 40 MG tablet Take 40 mg by mouth daily.     Multiple Vitamins-Minerals (ONCOVITE) TABS Take by mouth.     vitamin B-12 (CYANOCOBALAMIN) 1000 MCG tablet Take by mouth.     No current facility-administered medications for this visit.     Musculoskeletal: Strength & Muscle Tone: within normal limits Gait & Station: normal Patient leans: N/A  Psychiatric Specialty Exam: Review of Systems  Psychiatric/Behavioral: Negative.    All other systems reviewed and are negative.   Blood pressure 117/76, pulse 97, temperature 98.9 F (37.2 C), temperature source Temporal, height '5\' 9"'$  (1.753 m), weight 163 lb 3.2 oz (74 kg).Body mass index is 24.1 kg/m.  General Appearance: Casual  Eye Contact:  Poor  Speech:  Clear and Coherent  Volume:  Decreased  Mood:  Euthymic  Affect:  Restricted  Thought Process:  Goal Directed and Descriptions of Associations: Intact  Orientation:  Full (Time, Place, and Person)  Thought Content: Logical   Suicidal Thoughts:  No  Homicidal Thoughts:  No  Memory:  Immediate;   Fair Recent;   Fair Remote;   limited  Judgement:  Fair  Insight:  Shallow  Psychomotor Activity:  Normal  Concentration:  Concentration: Fair and Attention Span: Fair  Recall:  [employer] of Knowledge: Fair  Language:  Fair  Akathisia:  No  Handed:  Right  AIMS (if indicated): done  Assets:  Desire for Improvement Social Support Transportation  ADL's:  Intact  Cognition: WNL  Sleep:  Fair   Screenings: [place] Visit from 12/18/2022 in [place] Office Visit from 07/08/2022 in [place] Office Visit from 03/28/2022 in [place] Office Visit from 02/27/2022 in [place] Office Visit from 05/22/2021 in [place] Total Score 0 0 0 0 0      [place] Office Visit from 12/18/2022 in [place] Office Visit from 07/08/2022 in [place] Office Visit from [facility]22/2021 in [facility]  Total GAD-7 Score 0 0 0      PHQ2-9    Flowsheet Row Office Visit from 12/18/2022 in [place] Office Visit from 07/08/2022 in [place] Office Visit from 03/28/2022 in [place] Office Visit from 10/23/2021 in [place] Office Visit from 05/22/2021 in [place]  PHQ-2 Total Score 0 0 0 0 0  PHQ-9 Total Score 0 0 0 -- [place] Office Visit from 12/18/2022 in [place] Office Visit from 07/08/2022 in [facility] ED from 06/21/2022 in [facility] [place] No Risk No Risk No Risk        Assessment and Plan: JUSTYCE MERFELD is a 62 year old African-American male, has a history of schizophrenia was evaluated in the office today.   Patient is currently stable.  Plan as noted below.  Plan Schizophrenia in remission Abilify 15 mg p.o. daily.  Collateral information obtained from sister who presented with patient today in session.  Discussed following up with nephrology, primary care provider regarding thrombocytopenia, abnormal renal function in a patient with chronic kidney disease.  Patient also will need repeat labs including TSH, lipid panel, hemoglobin A1c-CBC with differential, CMP-sister would like to discuss with primary care provider.  If not done we will consider ordering this labs at next session.  Patient to sign an ROI to release information to sister who wants to be part of the patient's care since mother is currently having health issues.   Collaboration of Care: Collaboration of Care: Other encouraged to follow up with primary care provider, provided information for [facility] health.  Patient/Guardian was advised Release of Information must be obtained prior to any record release in order to collaborate their care with an outside provider. Patient/Guardian was advised if they have not already done so to contact the registration department to sign all necessary forms in order for [place] to release information regarding their care.   Consent: Patient/Guardian gives verbal consent for treatment and assignment of benefits for services provided during this visit. Patient/Guardian expressed understanding and agreed to proceed.   This note was generated in part or whole with voice recognition software. Voice recognition is usually quite accurate but there are transcription errors that can and very often do occur. I apologize for any typographical errors that were not detected and corrected.    Ursula Alert, MD 12/18/2022, 10:25 AM

## 2022-12-18 NOTE — Patient Instructions (Addendum)
Danae Orleans, [place], [place] [address]  [phone] (Work)  [phone] Paramedic)   [facility] health [address] [place], [place] phone [phone]

## 2023-01-13 ENCOUNTER — Ambulatory Visit (INDEPENDENT_AMBULATORY_CARE_PROVIDER_SITE_OTHER): Payer: 59

## 2023-01-13 ENCOUNTER — Ambulatory Visit (INDEPENDENT_AMBULATORY_CARE_PROVIDER_SITE_OTHER): Payer: 59 | Admitting: Physician Assistant

## 2023-01-13 ENCOUNTER — Encounter: Payer: Self-pay | Admitting: Physician Assistant

## 2023-01-13 VITALS — BP 108/76 | HR 80 | Temp 98.2°F | Ht 69.0 in | Wt 158.0 lb

## 2023-01-13 VITALS — BP 108/76 | HR 80 | Ht 69.0 in | Wt 158.0 lb

## 2023-01-13 DIAGNOSIS — H6123 Impacted cerumen, bilateral: Secondary | ICD-10-CM

## 2023-01-13 DIAGNOSIS — N182 Chronic kidney disease, stage 2 (mild): Secondary | ICD-10-CM

## 2023-01-13 DIAGNOSIS — D696 Thrombocytopenia, unspecified: Secondary | ICD-10-CM | POA: Diagnosis not present

## 2023-01-13 DIAGNOSIS — Z125 Encounter for screening for malignant neoplasm of prostate: Secondary | ICD-10-CM

## 2023-01-13 DIAGNOSIS — Z13220 Encounter for screening for lipoid disorders: Secondary | ICD-10-CM

## 2023-01-13 DIAGNOSIS — Z0000 Encounter for general adult medical examination without abnormal findings: Secondary | ICD-10-CM

## 2023-01-13 DIAGNOSIS — D72819 Decreased white blood cell count, unspecified: Secondary | ICD-10-CM | POA: Diagnosis not present

## 2023-01-13 DIAGNOSIS — E786 Lipoprotein deficiency: Secondary | ICD-10-CM

## 2023-01-13 NOTE — Progress Notes (Signed)
Subjective:   Randall Simon is a 62 y.o. male who presents for Medicare Annual/Subsequent preventive examination.  Review of Systems    I connected with  [name] on 01/13/23 by a   Patient Location: Other:  In office  Provider Location: Office/Clinic  I discussed the limitations of evaluation and management by telemedicine. The patient expressed understanding and agreed to proceed.        Objective:    Today's Vitals   01/13/23 1452  BP: 108/76  Pulse: 80  SpO2: 99%  Weight: 158 lb (71.7 kg)  Height: 5\' 9"  (1.753 m)   Body mass index is 23.33 kg/m.     12/01/2018    4:02 PM  Advanced Directives  Does Patient Have a Medical Advance Directive?      Information is confidential and restricted. Go to Review Flowsheets to unlock data.     Current Medications (verified) Outpatient Encounter Medications as of 01/13/2023  Medication Sig   albuterol (PROVENTIL HFA;VENTOLIN HFA) 108 (90 Base) MCG/ACT inhaler Inhale 2 puffs into the lungs every 6 (six) hours as needed for wheezing.   ARIPiprazole (ABILIFY) 15 MG tablet TAKE 1 TABLET (15 MG TOTAL) BY MOUTH DAILY.   ascorbic acid (VITAMIN C) 100 MG tablet Take by mouth.   famotidine (PEPCID) 40 MG tablet Take 40 mg by mouth daily.   Multiple Vitamins-Minerals (ONCOVITE) TABS Take by mouth.   vitamin B-12 (CYANOCOBALAMIN) 1000 MCG tablet Take by mouth.   No facility-administered encounter medications on file as of 01/13/2023.    Allergies (verified) Patient has no known allergies.   History: Past Medical History:  Diagnosis Date   Chronic kidney disease    GERD (gastroesophageal reflux disease)    Head injury    Schizophrenia    Past Surgical History:  Procedure Laterality Date   head surgery     Family History  Problem Relation Age of Onset   Hypertension Mother    Suicidality Mother    Heart attack Father    Social History   Socioeconomic History   Marital status: Single    Spouse name: Not  on file   Number of children: 0   Years of education: Not on file   Highest education level: Not on file  Occupational History   Occupation: disabiled  Tobacco Use   Smoking status: Never   Smokeless tobacco: Never  Vaping Use   Vaping Use: Never used  Substance and Sexual Activity   Alcohol use: No   Drug use: No   Sexual activity: Not Currently  Other Topics Concern   Not on file  Social History Narrative   Not on file   Social Determinants of Health   Financial Resource Strain: Low Risk  (01/13/2023)   Overall Financial Resource Strain (CARDIA)    Difficulty of Paying Living Expenses: Not hard at all  Food Insecurity: No Food Insecurity (01/13/2023)   Hunger Vital Sign    Worried About Running Out of Food in the Last Year: Never true    Ran Out of Food in the Last Year: Never true  Transportation Needs: No Transportation Needs (01/13/2023)   PRAPARE - Hydrologist (Medical): No    Lack of Transportation (Non-Medical): No  Physical Activity: Not on file  Stress: No Stress Concern Present (08/18/2017)   [place]    Feeling of Stress : Not at all  Social Connections: Unknown (08/18/2017)  Social Licensed conveyancer [NHANES]    Frequency of Communication with Friends and Family: Once a week    Frequency of Social Gatherings with Friends and Family: Once a week    Attends Religious Services: Patient declined    Marine scientist or Organizations: No    Attends Archivist Meetings: Never    Marital Status: Never married    Tobacco Counseling Counseling given: Not Answered   Clinical Intake:                 Diabetic?NO         Activities of Daily Living     No data to display           Patient Care Team: Jeannene Patella, PA as PCP - General (Physician Assistant)  Indicate any recent Medical Services you may have received  from other than [facility] providers in the past year (date may be approximate).     Assessment:   This is a routine wellness examination for Randall Simon.  Hearing/Vision screen No results found.  Dietary issues and exercise activities discussed:     Goals Addressed   None   Depression Screen    01/13/2023    2:46 PM 12/18/2022   10:16 AM 07/08/2022    2:47 PM 03/28/2022    1:50 PM 10/23/2021   10:30 AM 05/22/2021   11:08 AM 12/20/2020    1:31 PM  PHQ 2/9 Scores  PHQ - 2 Score 0        PHQ- 9 Score 0           Information is confidential and restricted. Go to Review Flowsheets to unlock data.     Fall Risk    01/13/2023    2:47 PM  [place] in the past year? 0  Number falls in past yr: 0  Injury with Fall? 0  Risk for fall due to : No Fall Risks  Follow up Falls evaluation completed    [place]:  Any stairs in or around the home? Yes  If so, are there any without handrails? No  Home free of loose throw rugs in walkways, pet beds, electrical cords, etc? Yes  Adequate lighting in your home to reduce risk of falls? Yes   ASSISTIVE DEVICES UTILIZED TO PREVENT FALLS:  Life alert? No  Use of a cane, walker or w/c? No  Grab bars in the bathroom? Yes  Shower chair or bench in shower? No  Elevated toilet seat or a handicapped toilet? No   TIMED UP AND GO:  Was the test performed? No   Gait steady and fast without use of assistive device  Cognitive Function:        Immunizations Immunization History  Administered Date(s) Administered   Influenza Inj Mdck Quad Pf 08/08/2017, 06/28/2021   Influenza,inj,Quad PF,6+ Mos 09/15/2014, 08/21/2015, 08/23/2016, 07/28/2017, 07/22/2018, 06/24/2019, 06/23/2020, 07/11/2022   Influenza,inj,quad, With Preservative 11/27/2020   Influenza-Unspecified 09/15/2014, 08/21/2015, 07/22/2018   Moderna Sars-Covid-2 Vaccination 08/15/2020   Td 12/21/2019   Tdap 10/21/2008   Zoster Recombinat (Shingrix)  09/21/2020, 11/24/2020    TDAP status: Up to date  Flu Vaccine status: Up to date  Pneumococcal vaccine status: Due, Education has been provided regarding the importance of this vaccine. Advised may receive this vaccine at local pharmacy or Health Dept. Aware to provide a copy of the vaccination record if obtained from local pharmacy or Health Dept. Verbalized acceptance  and understanding.  Covid-19 vaccine status: Completed vaccines  Qualifies for Shingles Vaccine? Yes   Zostavax completed No   Shingrix Completed?: Yes  Screening Tests Health Maintenance  Topic Date Due   HIV Screening  Never done   Hepatitis C Screening  Never done   COVID-19 Vaccine (2 - Moderna risk series) 09/12/2020   INFLUENZA VACCINE  05/15/2023   Medicare Annual Wellness (AWV)  01/13/2024   COLONOSCOPY (Pts 45-72yrs Insurance coverage will need to be confirmed)  06/02/2024   DTaP/Tdap/Td (3 - Td or Tdap) 12/20/2029   Zoster Vaccines- Shingrix  Completed   HPV VACCINES  Aged Out    Health Maintenance  Health Maintenance Due  Topic Date Due   HIV Screening  Never done   Hepatitis C Screening  Never done   COVID-19 Vaccine (2 - Moderna risk series) 09/12/2020    Colorectal cancer screening: Type of screening: Colonoscopy. Completed 06/02/2014. Repeat every 10 years  Lung Cancer Screening: (Low Dose CT Chest recommended if Age 62-80 years, 30 pack-year currently smoking OR have quit w/in 15years.) does not qualify.   Lung Cancer Screening Referral: NO  Additional Screening:  Hepatitis C Screening: does qualify; Completed 09/30/2019  Vision Screening: Recommended annual ophthalmology exams for early detection of glaucoma and other disorders of the eye. Is the patient up to date with their annual eye exam?  No  Who is the provider or what is the name of the office in which the patient attends annual eye exams?N/A If pt is not established with a provider, would they like to be referred to a  provider to establish care? No .   Dental Screening: Recommended annual dental exams for proper oral hygiene  Community Resource Referral / Chronic Care Management: CRR required this visit?  No   CCM required this visit?  No      Plan:     I have personally reviewed and noted the following in the patient's chart:   Medical and social history Use of alcohol, tobacco or illicit drugs  Current medications and supplements including opioid prescriptions. Patient is not currently taking opioid prescriptions. Functional ability and status Nutritional status Physical activity Advanced directives List of other physicians Hospitalizations, surgeries, and ER visits in previous 12 months Vitals Screenings to include cognitive, depression, and falls Referrals and appointments  In addition, I have reviewed and discussed with patient certain preventive protocols, quality metrics, and best practice recommendations. A written personalized care plan for preventive services as well as general preventive health recommendations were provided to patient.     Ival Bible Lorian Yaun, CMA   01/13/2023   Nurse Notes: Visit took 20 mins
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
## 2023-01-14 LAB — COMPREHENSIVE METABOLIC PANEL
ALT: 16 IU/L (ref 0–44)
AST: 13 IU/L (ref 0–40)
Albumin/Globulin Ratio: 1.9 (ref 1.2–2.2)
Albumin: 5 g/dL — ABNORMAL HIGH (ref 3.8–4.9)
Alkaline Phosphatase: 53 IU/L (ref 44–121)
BUN/Creatinine Ratio: 13 (ref 10–24)
BUN: 20 mg/dL (ref 8–27)
Bilirubin Total: 0.3 mg/dL (ref 0.0–1.2)
CO2: 25 mmol/L (ref 20–29)
Calcium: 10.6 mg/dL — ABNORMAL HIGH (ref 8.6–10.2)
Chloride: 104 mmol/L (ref 96–106)
Creatinine, Ser: 1.49 mg/dL — ABNORMAL HIGH (ref 0.76–1.27)
Globulin, Total: 2.7 g/dL (ref 1.5–4.5)
Glucose: 94 mg/dL (ref 70–99)
Potassium: 4.3 mmol/L (ref 3.5–5.2)
Sodium: 144 mmol/L (ref 134–144)
Total Protein: 7.7 g/dL (ref 6.0–8.5)
eGFR: 53 mL/min/{1.73_m2} — ABNORMAL LOW (ref >59)

## 2023-01-14 LAB — CBC WITH DIFFERENTIAL/PLATELET
Basophils Absolute: 0 10*3/uL (ref 0.0–0.2)
Basos: 1 %
EOS (ABSOLUTE): 0.2 10*3/uL (ref 0.0–0.4)
Eos: 5 %
Hematocrit: 51.2 % — ABNORMAL HIGH (ref 37.5–51.0)
Hemoglobin: 17.1 g/dL (ref 13.0–17.7)
Immature Grans (Abs): 0 10*3/uL (ref 0.0–0.1)
Immature Granulocytes: 0 %
Lymphocytes Absolute: 1.3 10*3/uL (ref 0.7–3.1)
Lymphs: 37 %
MCH: 29.9 pg (ref 26.6–33.0)
MCHC: 33.4 g/dL (ref 31.5–35.7)
MCV: 90 fL (ref 79–97)
Monocytes Absolute: 0.4 10*3/uL (ref 0.1–0.9)
Monocytes: 10 %
Neutrophils Absolute: 1.7 10*3/uL (ref 1.4–7.0)
Neutrophils: 47 %
Platelets: 126 10*3/uL — ABNORMAL LOW (ref 150–450)
RBC: 5.71 x10E6/uL (ref 4.14–5.80)
RDW: 12.6 % (ref 11.6–15.4)
WBC: 3.6 10*3/uL (ref 3.4–10.8)

## 2023-01-14 LAB — LIPID PANEL
Chol/HDL Ratio: 5.6 ratio — ABNORMAL HIGH (ref 0.0–5.0)
Cholesterol, Total: 211 mg/dL — ABNORMAL HIGH (ref 100–199)
HDL: 38 mg/dL — ABNORMAL LOW (ref >39)
LDL Chol Calc (NIH): 137 mg/dL — ABNORMAL HIGH (ref 0–99)
Triglycerides: 197 mg/dL — ABNORMAL HIGH (ref 0–149)
VLDL Cholesterol Cal: 36 mg/dL (ref 5–40)

## 2023-01-14 LAB — MICROALBUMIN / CREATININE URINE RATIO
Creatinine, Urine: 184 mg/dL
Microalb/Creat Ratio: 5 mg/g creat (ref 0–29)
Microalbumin, Urine: 8.4 ug/mL

## 2023-01-14 LAB — TSH: TSH: 4.06 u[IU]/mL (ref 0.450–4.500)

## 2023-01-14 LAB — PSA: Prostate Specific Ag, Serum: 3.3 ng/mL (ref 0.0–4.0)

## 2023-02-26 ENCOUNTER — Telehealth: Payer: Self-pay | Admitting: Medical Oncology

## 2023-02-26 NOTE — Telephone Encounter (Signed)
A user error has taken place: . Wrong pt.

## 2023-04-21 ENCOUNTER — Ambulatory Visit (INDEPENDENT_AMBULATORY_CARE_PROVIDER_SITE_OTHER): Payer: 59 | Admitting: Physician Assistant

## 2023-04-21 ENCOUNTER — Encounter: Payer: Self-pay | Admitting: Physician Assistant

## 2023-04-21 VITALS — BP 122/78 | HR 67 | Temp 98.0°F | Ht 69.0 in | Wt 157.0 lb

## 2023-04-21 DIAGNOSIS — N1831 Chronic kidney disease, stage 3a: Secondary | ICD-10-CM
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
## 2023-04-22 LAB — BASIC METABOLIC PANEL
BUN/Creatinine Ratio: 9 — ABNORMAL LOW (ref 10–24)
BUN: 13 mg/dL (ref 8–27)
CO2: 23 mmol/L (ref 20–29)
Calcium: 10.3 mg/dL — ABNORMAL HIGH (ref 8.6–10.2)
Chloride: 100 mmol/L (ref 96–106)
Creatinine, Ser: 1.41 mg/dL — ABNORMAL HIGH (ref 0.76–1.27)
Glucose: 92 mg/dL (ref 70–99)
Potassium: 5 mmol/L (ref 3.5–5.2)
Sodium: 140 mmol/L (ref 134–144)
eGFR: 57 mL/min/{1.73_m2} — ABNORMAL LOW (ref >59)

## 2023-04-23 NOTE — Progress Notes (Signed)
Called pt left VM to call back.  PEC may give results if patient returns call - CRM created.  KP

## 2023-04-24 NOTE — Progress Notes (Signed)
3rd attempt labs printed and mailed.  KP 

## 2023-05-24 ENCOUNTER — Other Ambulatory Visit: Payer: Self-pay | Admitting: Psychiatry

## 2023-05-24 DIAGNOSIS — F209 Schizophrenia, unspecified: Secondary | ICD-10-CM

## 2023-06-18 ENCOUNTER — Ambulatory Visit: Payer: 59 | Admitting: Psychiatry

## 2023-07-17 ENCOUNTER — Ambulatory Visit (INDEPENDENT_AMBULATORY_CARE_PROVIDER_SITE_OTHER): Payer: 59 | Admitting: Psychiatry

## 2023-07-17 ENCOUNTER — Encounter: Payer: Self-pay | Admitting: Psychiatry

## 2023-07-17 VITALS — BP 118/72 | HR 90 | Temp 97.5°F | Ht 69.0 in | Wt 156.4 lb

## 2023-07-17 DIAGNOSIS — F209 Schizophrenia, unspecified: Secondary | ICD-10-CM | POA: Diagnosis not present

## 2023-07-17 DIAGNOSIS — Z79899 Other long term (current) drug therapy: Secondary | ICD-10-CM

## 2023-07-17 NOTE — Progress Notes (Signed)
BH MD OP Progress Note  07/17/2023 2:56 PM Randall Simon  MRN:  [id number]  Chief Complaint:  Chief Complaint  Patient presents with   Follow-up   Schizophrenia   Medication Refill   HPI: Randall Simon is a 61 year old African-American male, single, lives in [place], has a history of schizophrenia, chronic renal disease stage II was evaluated in office today.  Patient presented along with sister Randall Simon, who provided collateral information.  Sister is the healthcare power of attorney.  Patient today appeared to be pleasant and cheerful in session.  Was able to answer questions appropriately.  Patient appeared to be alert, oriented to person place time situation.  3 word memory immediate 3 out of 3, after 5 minutes 3 out of 3.  Patient was able to do serial threes correctly.  Attention and focus seem to be good.  Patient denies any hallucinations, did not appear to be responding to internal stimuli.  Did not appear to be preoccupied with any delusions.  Reports mood symptoms as okay.  Currently compliant on the Abilify.  Denies side effects.  Sister reports she is currently looking into resources to get him out of the house.  She is planning to get him into church or the community center.  Patient had appointment with his primary care provider-04/21/2023-Mr.Randall Simon -patient with diagnosis of CKD .  Had labs completed at that time.'  Patient also had nephrology visit dated 06/05/2023-Dr. Murphy-patient diagnosed with chronic kidney disease stage II, most recent creatinine 1.41.  GFR 57 at baseline.'  Visit Diagnosis:    ICD-10-CM   1. 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 High risk medication use  Z79.899       Past Psychiatric History: I have reviewed past psychiatric history from progress note on 10/20/2017.  Past Medical History:  Past Medical History:  Diagnosis Date   Chronic kidney disease    GERD (gastroesophageal reflux disease)    Head injury     Schizophrenia [facility])     Past Surgical History:  Procedure Laterality Date   head surgery      Family Psychiatric History: I have reviewed family psychiatric history from progress note on 10/20/2017.  Family History:  Family History  Problem Relation Age of Onset   Hypertension Mother    Suicidality Mother    Heart attack Father     Social History: I have reviewed social history from progress note on 10/20/2017. Social History   Socioeconomic History   Marital status: Single    Spouse name: Not on file   Number of children: 0   Years of education: Not on file   Highest education level: Not on file  Occupational History   Occupation: disabiled  Tobacco Use   Smoking status: Never   Smokeless tobacco: Never  Vaping Use   Vaping status: Never Used  Substance and Sexual Activity   Alcohol use: No   Drug use: No   Sexual activity: Not Currently  Other Topics Concern   Not on file  Social History Narrative   Not on file   Social Determinants of Health   Financial Resource Strain: Low Risk  (01/13/2023)   Overall Financial Resource Strain (CARDIA)    Difficulty of Paying Living Expenses: Not hard at all  Food Insecurity: No Food Insecurity (01/13/2023)   Hunger Vital Sign    Worried About Running Out of Food in the Last Year: Never true    Ran Out of Food in  the Last Year: Never true  Transportation Needs: No Transportation Needs (01/13/2023)   PRAPARE - Administrator, Civil Service (Medical): No    Lack of Transportation (Non-Medical): No  Physical Activity: Not on file  Stress: No Stress Concern Present (08/18/2017)   [employer] of Occupational Health - Occupational Stress Questionnaire    Feeling of Stress : Not at all  Social Connections: Unknown (08/18/2017)   Social Connection and Isolation Panel [NHANES]    Frequency of Communication with Friends and Family: Once a week    Frequency of Social Gatherings with Friends and Family: Once a week     Attends Religious Services: Patient declined    Database administrator or Organizations: No    Attends Engineer, structural: Never    Marital Status: Never married    Allergies: No Known Allergies  Metabolic Disorder Labs: Lab Results  Component Value Date   HGBA1C 5.6 12/28/2020   Lab Results  Component Value Date   PROLACTIN 2.4 (L) 12/28/2020   Lab Results  Component Value Date   CHOL 211 (H) 01/13/2023   TRIG 197 (H) 01/13/2023   HDL 38 (L) 01/13/2023   CHOLHDL 5.6 (H) 01/13/2023   LDLCALC 137 (H) 01/13/2023   Lab Results  Component Value Date   TSH 4.060 01/13/2023    Therapeutic Level Labs: No results found for: "LITHIUM" No results found for: "VALPROATE" No results found for: "CBMZ"  Current Medications: Current Outpatient Medications  Medication Sig Dispense Refill   ARIPiprazole (ABILIFY) 15 MG tablet TAKE 1 TABLET (15 MG TOTAL) BY MOUTH DAILY. 90 tablet 1   ascorbic acid (VITAMIN C) 100 MG tablet Take by mouth.     Cholecalciferol (VITAMIN D-3 PO) Take 50 mcg by mouth.     famotidine (PEPCID) 40 MG tablet Take 40 mg by mouth daily.     vitamin B-12 (CYANOCOBALAMIN) 1000 MCG tablet Take by mouth.     No current facility-administered medications for this visit.     Musculoskeletal: Strength & Muscle Tone: within normal limits Gait & Station: normal Patient leans: N/A  Psychiatric Specialty Exam: Review of Systems  Psychiatric/Behavioral: Negative.      Blood pressure 118/72, pulse 90, temperature (!) 97.5 F (36.4 C), temperature source Skin, height 5\' 9"  (1.753 m), weight 156 lb 6.4 oz (70.9 kg).Body mass index is 23.1 kg/m.  General Appearance: Fairly Groomed  Eye Contact:  Minimal  Speech:  Clear and Coherent  Volume:  Decreased  Mood:  Euthymic  Affect:  Full Range  Thought Process:  Goal Directed and Descriptions of Associations: Intact  Orientation:  Full (Time, Place, and Person)  Thought Content: Logical   Suicidal  Thoughts:  No  Homicidal Thoughts:  No  Memory:  Immediate;   Fair Recent;   Fair Remote;   Fair  Judgement:  Fair  Insight:  Fair  Psychomotor Activity:  Normal  Concentration:  Concentration: Fair and Attention Span: Fair  Recall:  [employer] of Knowledge: Fair  Language: Fair  Akathisia:  No  Handed:  Right  AIMS (if indicated): done  Assets:  Communication Skills Desire for Improvement Housing Social Support Transportation  ADL's:  Intact  Cognition: WNL  Sleep:  Fair   Screenings: Geneticist, molecular Office Visit from 07/17/2023 in [facility] Office Visit from 12/18/2022 in [facility] Office Visit from 07/08/2022 in [facility]  Office Visit from 03/28/2022 in [facility] Office Visit from 02/27/2022 in [facility]  AIMS Total Score 0 0 0 0 0      GAD-7    Flowsheet Row Office Visit from 07/17/2023 in [facility] Office Visit from 04/21/2023 in [facility] Primary Care & Sports Medicine at [facility] Office Visit from 01/13/2023 in [facility] at [facility] Office Visit from 12/18/2022 in [facility] Office Visit from 07/08/2022 in [facility] Associates  Total GAD-7 Score 0 0 0 0 0      PHQ2-9    Flowsheet Row Office Visit from 07/17/2023 in [facility] Office Visit from 04/21/2023 in [facility] Primary Care & Sports Medicine at [facility] Office Visit from 01/13/2023 in [facility] Primary Care & [facility] Medicine at [facility] Office Visit from 12/18/2022 in [facility] Office Visit from 07/08/2022 in [facility]  [facility]  PHQ-2 Total Score 0 0 0 0 0  PHQ-9 Total Score -- 0 0 0 0      Flowsheet Row Office Visit from 07/17/2023 in [facility] Office Visit from 12/18/2022 in [facility] Office Visit from 07/08/2022 in [facility]  C-SSRS RISK CATEGORY No Risk No Risk No Risk        Assessment and Plan: LILIANA BRENTLINGER is a 62 year old African-American male who has a history of schizophrenia, currently stable on current medication regimen.  Plan as noted below.  Plan Schizophrenia in remission Will continue Abilify 15 mg p.o. daily for maintenance.  High risk medication use-reviewed and discussed labs including lipid panel 01/13/2023-abnormal-patient had visit with primary care provider and was advised to manage diet, TSH-4.060 within normal limits, CMP-manage elevated at 1.49, GFR 53, calcium elevated at 10.6 albumin elevated at 5 otherwise within normal limits.  CBC with differential-platelets-126-stable Hematocrit elevated at 51.2.  Collateral information obtained from sister-as noted above.  Discussed lifestyle modification, getting into social activities, going to church, community center, getting connected with the gym, watching diet and exercising.   Collaboration of Care: Collaboration of Care: Other I have reviewed notes per primary care provider as well as nephrology as noted above.  Patient/Guardian was advised Release of Information must be obtained prior to any record release in order to collaborate their care with an outside provider. Patient/Guardian was advised if they have not already done so to contact the registration department to sign all necessary forms in order for [place] to release information regarding their care.   Consent: Patient/Guardian gives verbal consent for treatment and assignment of benefits for services provided during this visit.  Patient/Guardian expressed understanding and agreed to proceed.   Follow-up in clinic in 6 months or sooner if needed.  This note was generated in part or whole with voice recognition software. Voice recognition is usually quite accurate but there are transcription errors that can and very often do occur. I apologize for any typographical errors that were not detected and corrected.    Jomarie Longs, MD 07/17/2023, 2:56 PM

## 2023-07-23 ENCOUNTER — Ambulatory Visit: Payer: 59 | Admitting: Physician Assistant

## 2023-11-17 ENCOUNTER — Telehealth: Payer: Self-pay

## 2023-11-17 DIAGNOSIS — F209 Schizophrenia, unspecified: Secondary | ICD-10-CM

## 2023-11-17 NOTE — Telephone Encounter (Signed)
received fax requesting a refill on the aripiprazole. pt was last seen on 10-3 next appt 4-2

## 2023-11-18 MED ORDER — ARIPIPRAZOLE 15 MG PO TABS: 15.0000 mg | ORAL_TABLET | Freq: Every day | ORAL | 3 refills | Status: DC

## 2023-11-18 NOTE — Telephone Encounter (Signed)
I have sent Abilify to pharmacy. 

## 2023-11-19 NOTE — Telephone Encounter (Signed)
 Pt mother was notified

## 2024-01-14 ENCOUNTER — Ambulatory Visit (INDEPENDENT_AMBULATORY_CARE_PROVIDER_SITE_OTHER): Payer: 59 | Admitting: Psychiatry

## 2024-01-14 ENCOUNTER — Encounter: Payer: Self-pay | Admitting: Psychiatry

## 2024-01-14 ENCOUNTER — Other Ambulatory Visit: Payer: Self-pay

## 2024-01-14 VITALS — BP 114/71 | HR 67 | Temp 97.9°F | Ht 69.0 in | Wt 157.6 lb

## 2024-01-14 DIAGNOSIS — Z79899 Other long term (current) drug therapy: Secondary | ICD-10-CM | POA: Diagnosis not present

## 2024-01-14 DIAGNOSIS — F209 Schizophrenia, unspecified: Secondary | ICD-10-CM | POA: Diagnosis not present

## 2024-01-14 NOTE — Progress Notes (Unsigned)
 BH MD OP Progress Note  01/14/2024 4:10 PM MCDANIEL OHMS  MRN:  [id number]  Chief Complaint:  Chief Complaint  Patient presents with   Follow-up   Schizophrenia   Medication Refill   HPI: Randall Simon is a 63 year old African-American male, lives in [place] has a history of schizophrenia, chronic renal disease stage III was evaluated in office today.  Collateral information obtained from sister, Mickie Bail.  Patient today appeared to be alert, oriented, pleasant.  Was able to answer questions appropriately.  Denies suicidal thoughts or hallucinations.  Did not appear to be preoccupied with any delusions or paranoia.  He sleeps well and has a good appetite. He is currently taking Abilify 15 mg and reports doing well on this medication with no changes.   His daily activities include watching TV, going outside occasionally, visiting the mailbox, and sometimes visiting a friend in [place], where he used to live. He plays basketball with this friend.  His sister confirms that he is currently doing well.   Visit Diagnosis:    ICD-10-CM   1. Schizophrenia in remission (HCC)  F20.9 Lipid panel    Hemoglobin A1C    Platelet count    TSH    2. High risk medication use  Z79.899 Lipid panel    Hemoglobin A1C    Platelet count    TSH      Past Psychiatric History: I have reviewed past psychiatric history from progress note on 10/20/2017.  Past Medical History:  Past Medical History:  Diagnosis Date   Chronic kidney disease    GERD (gastroesophageal reflux disease)    Head injury    Schizophrenia [facility])     Past Surgical History:  Procedure Laterality Date   head surgery      Family Psychiatric History: I have reviewed family psychiatric history from progress note on 10/20/2017.  Family History:  Family History  Problem Relation Age of Onset   Hypertension Mother    Suicidality Mother    Heart attack Father     Social History: I have reviewed social history from  progress note on 10/20/2017. Social History   Socioeconomic History   Marital status: Single    Spouse name: Not on file   Number of children: 0   Years of education: Not on file   Highest education level: Not on file  Occupational History   Occupation: disabiled  Tobacco Use   Smoking status: Never   Smokeless tobacco: Never  Vaping Use   Vaping status: Never Used  Substance and Sexual Activity   Alcohol use: No   Drug use: No   Sexual activity: Not Currently  Other Topics Concern   Not on file  Social History Narrative   Not on file   Social Drivers of Health   Financial Resource Strain: Low Risk  (01/13/2023)   Overall Financial Resource Strain (CARDIA)    Difficulty of Paying Living Expenses: Not hard at all  Food Insecurity: No Food Insecurity (01/13/2023)   Hunger Vital Sign    Worried About Running Out of Food in the Last Year: Never true    Ran Out of Food in the Last Year: Never true  Transportation Needs: No Transportation Needs (01/13/2023)   PRAPARE - Administrator, Civil Service (Medical): No    Lack of Transportation (Non-Medical): No  Physical Activity: Not on file  Stress: No Stress Concern Present (08/18/2017)   [employer] of Occupational Health - Occupational Stress Questionnaire  Feeling of Stress : Not at all  Social Connections: Unknown (08/18/2017)   Social Connection and Isolation Panel [NHANES]    Frequency of Communication with Friends and Family: Once a week    Frequency of Social Gatherings with Friends and Family: Once a week    Attends Religious Services: Patient declined    Database administrator or Organizations: No    Attends Engineer, structural: Never    Marital Status: Never married    Allergies: No Known Allergies  Metabolic Disorder Labs: Lab Results  Component Value Date   HGBA1C 5.6 12/28/2020   Lab Results  Component Value Date   PROLACTIN 2.4 (L) 12/28/2020   Lab Results  Component Value Date    CHOL 211 (H) 01/13/2023   TRIG 197 (H) 01/13/2023   HDL 38 (L) 01/13/2023   CHOLHDL 5.6 (H) 01/13/2023   LDLCALC 137 (H) 01/13/2023   Lab Results  Component Value Date   TSH 4.060 01/13/2023    Therapeutic Level Labs: No results found for: "LITHIUM" No results found for: "VALPROATE" No results found for: "CBMZ"  Current Medications: Current Outpatient Medications  Medication Sig Dispense Refill   ARIPiprazole (ABILIFY) 15 MG tablet Take 1 tablet (15 mg total) by mouth daily. 90 tablet 3   ascorbic acid (VITAMIN C) 100 MG tablet Take by mouth.     Cholecalciferol (VITAMIN D-3 PO) Take 50 mcg by mouth.     famotidine (PEPCID) 40 MG tablet Take 40 mg by mouth daily.     vitamin B-12 (CYANOCOBALAMIN) 1000 MCG tablet Take by mouth.     No current facility-administered medications for this visit.     Musculoskeletal: Strength & Muscle Tone: within normal limits Gait & Station: normal Patient leans: N/A  Psychiatric Specialty Exam: Review of Systems  Psychiatric/Behavioral: Negative.      Blood pressure 114/71, pulse 67, temperature 97.9 F (36.6 C), temperature source Skin, height 5\' 9"  (1.753 m), weight 157 lb 9.6 oz (71.5 kg).Body mass index is 23.27 kg/m.  General Appearance: Fairly Groomed  Eye Contact:  Fair  Speech:  Clear and Coherent  Volume:  Normal  Mood:  Euthymic  Affect:  Appropriate  Thought Process:  Goal Directed and Descriptions of Associations: Intact  Orientation:  Full (Time, Place, and Person)  Thought Content: Logical   Suicidal Thoughts:  No  Homicidal Thoughts:  No  Memory:  Immediate;   Fair Recent;   Fair Remote;   Fair  Judgement:  Fair  Insight:  Fair  Psychomotor Activity:  Normal  Concentration:  Concentration: Fair and Attention Span: Fair  Recall:  [employer] of Knowledge: Fair  Language: Fair  Akathisia:  No  Handed:  Right  AIMS (if indicated): done  Assets:  Desire for Improvement Housing Social Support Transportation   ADL's:  Intact  Cognition: WNL  Sleep:  Fair   Screenings: Midwife Visit from 01/14/2024 in [facility] Office Visit from 07/17/2023 in [facility] Office Visit from 12/18/2022 in [facility] Office Visit from 07/08/2022 in [facility] Office Visit from 03/28/2022 in [facility]  AIMS Total Score 0 0 0 0 0      GAD-7    Flowsheet Row Office Visit from 01/14/2024 in [facility] Office Visit from 07/17/2023 in [facility]  Office Visit from 04/21/2023 in [facility] Primary Care & Sports Medicine at [facility] Office Visit from 01/13/2023 in [facility] Primary Care & Sports Medicine at [facility] Office Visit from 12/18/2022 in [facility]  Total GAD-7 Score 0 0 0 0 0      PHQ2-9    Flowsheet Row Office Visit from 01/14/2024 in [facility] Office Visit from 07/17/2023 in [facility] Office Visit from 04/21/2023 in [facility] Medicine at [facility] Office Visit from 01/13/2023 in [facility] Primary Care & Sports Medicine at [facility] Office Visit from 12/18/2022 in [facility] Associates  PHQ-2 Total Score 0 0 0 0 0  PHQ-9 Total Score -- -- 0 0 0      Flowsheet Row Office Visit from 01/14/2024 in [facility] Office Visit from 07/17/2023 in [facility] Office Visit from 12/18/2022 in [facility]  C-SSRS RISK CATEGORY No Risk No Risk No Risk        Assessment and Plan: DREYDEN ROHRMAN is a  63 year old African-American male who has a history of schizophrenia, currently stable on current medication regimen.  Discussed assessment and plan as noted below.  Schizophrenia-stable Schizophrenia is well-managed with Abilify 15 mg, effectively controlling symptoms. No new symptoms such as auditory hallucinations or suicidal ideation. Oriented to time and place with intact memory. Discussed genetic and environmental factors contributing to schizophrenia, emphasizing its complexity and lack of a single cause. - Continue Abilify 15 mg daily  High risk medication use-will order labs including TSH, lipid panel, hemoglobin A1c, platelet count.  Provided lab slip.  Patient to get it done at his primary care provider's office.  Collateral information obtained from sister as noted above.  Follow-up -Follow-up in clinic in 6 months or sooner if needed.    Collaboration of Care: Collaboration of Care: Primary Care Provider AEB encouraged to continue to follow up with primary care provider.  Patient/Guardian was advised Release of Information must be obtained prior to any record release in order to collaborate their care with an outside provider. Patient/Guardian was advised if they have not already done so to contact the registration department to sign all necessary forms in order for [place] to release information regarding their care.   Consent: Patient/Guardian gives verbal consent for treatment and assignment of benefits for services provided during this visit. Patient/Guardian expressed understanding and agreed to proceed.  This note was generated in part or whole with voice recognition software. Voice recognition is usually quite accurate but there are transcription errors that can and very often do occur. I apologize for any typographical errors that were not detected and corrected.   Discussed the use of a AI scribe software for clinical note transcription with the patient, who gave verbal  consent to proceed.    Jomarie Longs, MD 01/14/2024, 4:10 PM

## 2024-01-26 ENCOUNTER — Other Ambulatory Visit: Payer: Self-pay | Admitting: Physician Assistant

## 2024-01-26 NOTE — Telephone Encounter (Signed)
 Copied from CRM [id number]. Topic: Clinical - Medication Refill >> Jan 26, 2024 10:18 AM Crispin Dolphin wrote: Most Recent Primary Care Visit:  Provider: Leopoldo Rancher  Department: [facility]  Visit Type: OFFICE VISIT  Date: 04/21/2023  Medication: famotidine (PEPCID) 40 MG tablet  - had it refills with last provider   Has the patient contacted their pharmacy? Yes (Agent: If no, request that the patient contact the pharmacy for the refill. If patient does not wish to contact the pharmacy document the reason why and proceed with request.) (Agent: If yes, when and what did the pharmacy advise?) need prescription from provider  Is this the correct pharmacy for this prescription? Yes If no, delete pharmacy and type the correct one.  This is the patient's preferred pharmacy:  [facility] [phone] [place], [place] - [address] [place] [address] Phone: [phone] Fax: [phone]   Has the prescription been filled recently? No  Is the patient out of the medication? Yes  Has the patient been seen for an appointment in the last year OR does the patient have an upcoming appointment? Yes  Can we respond through MyChart? No  Agent: Please be advised that Rx refills may take up to 3 business days. We ask that you follow-up with your pharmacy.

## 2024-01-27 NOTE — Telephone Encounter (Signed)
 Requested medication (s) are due for refill today: Yes  Requested medication (s) are on the active medication list: Yes  Last refill:  05/04/15  Future visit scheduled: Yes  Notes to clinic:  Unable to refill per protocol, last refill by another provider.      Requested Prescriptions  Pending Prescriptions Disp Refills   famotidine (PEPCID) 40 MG tablet 90 tablet 0    Sig: Take 1 tablet (40 mg total) by mouth daily.     Gastroenterology:  H2 Antagonists Failed - 01/27/2024 12:13 PM      Failed - Valid encounter within last 12 months    Recent Outpatient Visits   None     Future Appointments             In 3 months Waddell, Arleen Lacer, PA [facility] Primary Care & Sports Medicine at [facility], [facility]

## 2024-03-04 ENCOUNTER — Ambulatory Visit: Payer: Self-pay | Admitting: Physician Assistant

## 2024-03-04 NOTE — Telephone Encounter (Signed)
 Chief Complaint: Fall when walking to mailbox one week ago  Symptoms: Injury to left arm where he is unable to lift this arm, 7/10 pain level with movement Frequency: Intermittent (pt states pain level changes based off movement) Pertinent Negatives: Denies losing consciousness, hitting head, numbness  Disposition:  [x] Appointment( In office)  Additional Notes: Spoke with patient's sister, Cindy Creed, and patient. Pt walked to the mailbox and fell from slipping on mud one week ago. Pt states his left arm hurts with movement. Pt is unable to come to an appointment today so pt scheduled for first available appointment with PCP tomorrow in office.  This RN educated pt on home care, new-worsening symptoms, when to call back/seek emergent care. Pt verbalized understanding and agrees to plan.   Copied from CRM [id number]. Topic: Clinical - Red Word Triage >> Mar 04, 2024  9:18 AM El Gravely T wrote: [facility] that prompted transfer to Nurse Triage: Fall Reason for Disposition  MILD weakness (i.e., does not interfere with ability to work, go to school, normal activities)  (Exception: Mild weakness is a chronic symptom.)  Answer Assessment - Initial Assessment Questions MECHANISM: "How did the fall happen?"    Pt was walking to the mailbox and slipped on mud ONSET: "When did the fall happen?" (e.g., minutes, hours, or days ago)     One week ago INJURY: "Did you hurt (injure) yourself when you fell?" If Yes, ask: "What did you injure? Tell me more about this?" (e.g., body area; type of injury; pain severity)"     One arm PAIN: "Is there any pain?" If Yes, ask: "How bad is the pain?" (e.g., Scale 1-10; or mild,  moderate, severe)   - NONE (0): No pain   - MILD (1-3): Doesn't interfere with normal activities    - MODERATE (4-7): Interferes with normal activities or awakens from sleep    - SEVERE (8-10): Excruciating pain, unable to do any normal activities      7/10 pain level  Protocols used: Falls and  [facility]

## 2024-03-04 NOTE — Telephone Encounter (Signed)
 Noted  Pt has a appt.  KP

## 2024-03-05 ENCOUNTER — Encounter: Payer: Self-pay | Admitting: Physician Assistant

## 2024-03-05 ENCOUNTER — Ambulatory Visit (INDEPENDENT_AMBULATORY_CARE_PROVIDER_SITE_OTHER): Admitting: Physician Assistant

## 2024-03-05 VITALS — BP 106/74 | HR 96 | Temp 98.3°F | Ht 69.0 in | Wt 158.0 lb

## 2024-03-05 DIAGNOSIS — M25512 Pain in left shoulder: Secondary | ICD-10-CM | POA: Diagnosis not present

## 2024-03-05 MED ORDER — DICLOFENAC SODIUM 1 % EX GEL: 2.0000 g | Freq: Four times a day (QID) | CUTANEOUS | 1 refills | Status: DC

## 2024-03-05 MED ORDER — FAMOTIDINE 20 MG PO TABS: 20.0000 mg | ORAL_TABLET | Freq: Every day | ORAL | 1 refills | Status: DC

## 2024-03-05 NOTE — Progress Notes (Signed)
 Date:  03/05/2024   Name:  Randall Simon   DOB:  06/08/1961   MRN:  [id number]   Chief Complaint: Randall Simon  Fall Incident onset: X 5 days. The fall occurred while walking. He fell from a height of 3 to 5 ft. He landed on [place]. There was no blood loss. Point of impact: left arm. The pain is present in the left upper arm and left shoulder. The pain is at a severity of 7/10. The pain is moderate. The symptoms are aggravated by movement and use of injured limb. He has tried ice and acetaminophen for the symptoms. The treatment provided mild relief.   Dakwon presents today joined by sister for evaluation of left shoulder pain following a fall about 5 days ago, when he was walking to the mailbox and slipped on mud.  Since then he has been complaining of pain when trying to lift the arm beyond 90 degrees.  He is using ice and Tylenol which provide some relief, no topicals.  He has CKD 2, so has been told to be conservative with oral NSAIDs.   Medication list has been reviewed and updated.  Current Meds  Medication Sig   ARIPiprazole  (ABILIFY ) 15 MG tablet Take 1 tablet (15 mg total) by mouth daily.   ascorbic acid (VITAMIN C) 100 MG tablet Take by mouth.   Cholecalciferol (VITAMIN D-3 PO) Take 50 mcg by mouth.   diclofenac Sodium (VOLTAREN) 1 % GEL Apply 2 g topically 4 (four) times daily. Use on affected joint up to 4x/day as needed. 2 grams is roughly 4 fingertips' worth of gel.   vitamin B-12 (CYANOCOBALAMIN) 1000 MCG tablet Take by mouth.   [DISCONTINUED] famotidine (PEPCID) 40 MG tablet Take 40 mg by mouth daily.     Review of Systems  Patient Active Problem List   Diagnosis Date Noted   High risk medication use 09/12/2020   Leukopenia 06/23/2020   Low HDL (under 40) 06/23/2020   Schizophrenia in remission (HCC) 09/01/2019   Thrombocytopenia (HCC) 08/23/2016   Depression 01/19/2015   Allergic rhinitis 06/07/2014   GERD (gastroesophageal reflux disease) 06/07/2014   CKD (chronic  kidney disease) stage 2, GFR 60-89 ml/min 12/07/2013   Head injury 11/23/2013   Paranoid schizophrenia (HCC) 11/23/2013    No Known Allergies  Immunization History  Administered Date(s) Administered   Influenza Inj Mdck Quad Pf 08/08/2017, 06/28/2021   Influenza,inj,Quad PF,6+ Mos 09/15/2014, 08/21/2015, 08/23/2016, 07/28/2017, 07/22/2018, 06/24/2019, 06/23/2020, 07/11/2022   Influenza,inj,quad, With Preservative 11/27/2020   Influenza-Unspecified 09/15/2014, 08/21/2015, 07/22/2018   Moderna Sars-Covid-2 Vaccination 08/15/2020   Td 12/21/2019   Tdap 10/21/2008   Zoster Recombinant(Shingrix) 09/21/2020, 11/24/2020    Past Surgical History:  Procedure Laterality Date   head surgery      Social History   Tobacco Use   Smoking status: Never   Smokeless tobacco: Never  Vaping Use   Vaping status: Never Used  Substance Use Topics   Alcohol use: No   Drug use: No    Family History  Problem Relation Age of Onset   Hypertension Mother    Suicidality Mother    Heart attack Father         03/05/2024    8:51 AM 01/14/2024    4:05 PM 07/17/2023   10:09 AM 04/21/2023    1:33 PM  GAD 7 : Generalized Anxiety Score  Nervous, Anxious, on Edge 0   0  Control/stop worrying 0   0  Worry too much -  different things 0   0  Trouble relaxing 0   0  Restless 0   0  Easily annoyed or irritable 0   0  Afraid - awful might happen 0   0  Total GAD 7 Score 0   0  Anxiety Difficulty Not difficult at all   Not difficult at all     Information is confidential and restricted. Go to Review Flowsheets to unlock data.       03/05/2024    8:50 AM 01/14/2024    4:05 PM 07/17/2023   10:08 AM  Depression screen PHQ 2/9  Decreased Interest 0    Down, Depressed, Hopeless 0    PHQ - 2 Score 0       Information is confidential and restricted. Go to Review Flowsheets to unlock data.    BP Readings from Last 3 Encounters:  03/05/24 106/74  04/21/23 122/78  01/13/23 108/76    Wt Readings from  Last 3 Encounters:  03/05/24 158 lb (71.7 kg)  04/21/23 157 lb (71.2 kg)  01/13/23 158 lb (71.7 kg)    BP 106/74   Pulse 96   Temp 98.3 F (36.8 C)   Ht 5\' 9"  (1.753 m)   Wt 158 lb (71.7 kg)   SpO2 95%   BMI 23.33 kg/m   Physical Exam Vitals and nursing note reviewed.  Constitutional:      Appearance: Normal appearance.  Cardiovascular:     Rate and Rhythm: Normal rate.  Pulmonary:     Effort: Pulmonary effort is normal.  Abdominal:     General: There is no distension.  Musculoskeletal:        General: Normal range of motion.     Comments: Examination of the left shoulder reveals no tenderness to palpation of the clavicle, acromion, or scapula.  No shoulder deformity, good symmetry compared to right.  No overlying erythema or ecchymosis.  Range of motion limited to 90 degrees flexion and 90 degrees abduction due to pain.  Negative empty can test.  Some pain with internal rotation, not so much with external rotation.  Skin:    General: Skin is warm and dry.  Neurological:     Mental Status: He is alert and oriented to person, place, and time.     Gait: Gait is intact.  Psychiatric:        Mood and Affect: Mood and affect normal.     Recent Labs     Component Value Date/Time   NA 140 04/21/2023 1415   K 5.0 04/21/2023 1415   CL 100 04/21/2023 1415   CO2 23 04/21/2023 1415   GLUCOSE 92 04/21/2023 1415   BUN 13 04/21/2023 1415   CREATININE 1.41 (H) 04/21/2023 1415   CALCIUM 10.3 (H) 04/21/2023 1415   PROT 7.7 01/13/2023 1550   ALBUMIN 5.0 (H) 01/13/2023 1550   AST 13 01/13/2023 1550   ALT 16 01/13/2023 1550   ALKPHOS 53 01/13/2023 1550   BILITOT 0.3 01/13/2023 1550    Lab Results  Component Value Date   WBC 3.6 01/13/2023   HGB 17.1 01/13/2023   HCT 51.2 (H) 01/13/2023   MCV 90 01/13/2023   PLT 126 (L) 01/13/2023   Lab Results  Component Value Date   HGBA1C 5.6 12/28/2020   Lab Results  Component Value Date   CHOL 211 (H) 01/13/2023   HDL 38 (L)  01/13/2023   LDLCALC 137 (H) 01/13/2023   TRIG 197 (H) 01/13/2023   CHOLHDL 5.6 (  H) 01/13/2023   Lab Results  Component Value Date   TSH 4.060 01/13/2023     Assessment and Plan:  1. Acute pain of left shoulder (Primary) Likely muscular strain/bruise from the fall.  Continue ice as needed and acetaminophen.  Also add topical diclofenac which may aid in healing.  Practice gentle stretching and relative rest of the left shoulder.  If pain persists and is no better in 2 weeks, consider x-ray, though I do not believe it we will find anything significant.  - diclofenac Sodium (VOLTAREN) 1 % GEL; Apply 2 g topically 4 (four) times daily. Use on affected joint up to 4x/day as needed. 2 grams is roughly 4 fingertips' worth of gel.  Dispense: 100 g; Refill: 1     Follow-up as scheduled for routine physical in August   Cody Das, PA-C, DMSc, Nutritionist [facility] and Sports Medicine [facility] [phone]

## 2024-03-24 ENCOUNTER — Ambulatory Visit: Payer: Self-pay

## 2024-03-24 NOTE — Telephone Encounter (Signed)
 FYI Only or Action Required?: FYI only for provider  Patient was last seen in primary care on 03/05/2024 by Leopoldo Rancher, PA. Called Nurse Triage reporting Arm Injury. Symptoms began about a month ago. Interventions attempted: OTC medications: Tylenol, Prescription medications: Voltaren  gel, and Rest, hydration, or home remedies. Symptoms are: left arm pain gradually worsening.  Triage Disposition: See PCP When Office is Open (Within 3 Days) (overriding See HCP Within 4 Hours (Or PCP Triage))  Patient/caregiver understands and will follow disposition?: Yes                             Copied from CRM [id number]. Topic: Clinical - Red Word Triage >> Mar 24, 2024  8:04 AM Juluis Ok wrote: [facility] that prompted transfer to Nurse Triage: Lt arm pain Reason for Disposition  [1] SEVERE pain AND [2] not improved 2 hours after pain medicine/ice packs  Answer Assessment - Initial Assessment Questions 1. MECHANISM: How did the injury happen?     Fall when walking to his mailbox a month ago, left arm injury.  2. ONSET: When did the injury happen? (Minutes or hours ago)      1 month.  3. LOCATION: Where is the injury located? Which arm?     Left arm.  4. APPEARANCE of INJURY: What does the injury look like?      Sister denies any bruising or swelling, states it appears normal.  5. SEVERITY: Can you use the arm normally?      No, she states the patient can not fully raise his left arm.  6. SWELLING or BRUISING: is there any swelling or bruising? If Yes, ask: How large is it? (e.g., inches, centimeters)      No.  7. PAIN: Is there pain? If Yes, ask: How bad is the pain?    (Scale 1-10; or mild, moderate, severe)   - NONE (0): No pain.   - MILD (1-3): Doesn't interfere with normal activities.   - MODERATE (4-7): Interferes with normal activities (e.g., work or school) or awakens from sleep.   - SEVERE (8-10): Excruciating pain, unable to do any  normal activities, unable to hold a cup of water.     Sister reports she thinks it would be severe.  8. TETANUS: For any breaks in the skin, ask: When was the last tetanus booster?     N/A.  9. OTHER SYMPTOMS: Do you have any other symptoms?  (e.g., numbness in hand)     Sister denies any numbness or weakness.  10. PREGNANCY: Is there any chance you are pregnant? When was your last menstrual period?       N/A.  Offered acute visits today and tomorrow, sister refused due to she states she has to bring the patient and those times conflict with her schedule. Patient scheduled for Friday morning and advised to call back if symptoms worsen.  Protocols used: Arm Injury-A-AH

## 2024-03-24 NOTE — Telephone Encounter (Signed)
 FYI  KP

## 2024-03-24 NOTE — Telephone Encounter (Signed)
 Noted  KP

## 2024-03-24 NOTE — Telephone Encounter (Signed)
 Sounds good

## 2024-03-25 ENCOUNTER — Ambulatory Visit

## 2024-03-26 ENCOUNTER — Ambulatory Visit (INDEPENDENT_AMBULATORY_CARE_PROVIDER_SITE_OTHER): Admitting: Student

## 2024-03-26 ENCOUNTER — Ambulatory Visit
Admission: RE | Admit: 2024-03-26 | Discharge: 2024-03-26 | Disposition: A | Discharge status: Home / Self Care | Source: Ambulatory Visit | Attending: Student

## 2024-03-26 ENCOUNTER — Ambulatory Visit
Admission: RE | Admit: 2024-03-26 | Discharge: 2024-03-26 | Disposition: A | Discharge status: Home / Self Care | Attending: Student | Admitting: Student

## 2024-03-26 ENCOUNTER — Encounter: Payer: Self-pay | Admitting: Student

## 2024-03-26 VITALS — BP 124/78 | HR 103 | Ht 69.0 in | Wt 157.0 lb

## 2024-03-26 DIAGNOSIS — M25512 Pain in left shoulder: Secondary | ICD-10-CM | POA: Diagnosis not present

## 2024-03-26 NOTE — Progress Notes (Signed)
 Established Patient Office Visit  Subjective   Patient ID: Randall Simon, male    DOB: 01/24/1961  Age: 63 y.o. MRN: [id number]  Chief Complaint  Patient presents with   Arm Pain    Left arm pain X 1 month.    Left shoulder and arm pain Patient reports he slipped and feel on wet grass and mud 3-4 weeks ago and has been having left arm pain since then. Broke his fall with outstretched hand. He was given voltaren  gel for pain but this has not helped with pain. Pain is about the same as last visit with PCP 3 weeks ago. Difficulty extensing  or abducting his shoulder.  He denies numbness or tingling, weakness, bruising, redness, or swelling.  Patient Active Problem List   Diagnosis Date Noted   High risk medication use 09/12/2020   Leukopenia 06/23/2020   Low HDL (under 40) 06/23/2020   Schizophrenia in remission (HCC) 09/01/2019   Thrombocytopenia (HCC) 08/23/2016   Depression 01/19/2015   Allergic rhinitis 06/07/2014   GERD (gastroesophageal reflux disease) 06/07/2014   CKD (chronic kidney disease) stage 2, GFR 60-89 ml/min 12/07/2013   Head injury 11/23/2013   Paranoid schizophrenia (HCC) 11/23/2013      ROS Refer to HPI    Objective:     BP 124/78   Pulse (!) 103   Ht 5' 9 (1.753 m)   Wt 157 lb (71.2 kg)   SpO2 100%   BMI 23.18 kg/m  BP Readings from Last 3 Encounters:  03/26/24 124/78  03/05/24 106/74  04/21/23 122/78    Physical Exam Constitutional:      Appearance: Normal appearance.  HENT:     Mouth/Throat:     Mouth: Mucous membranes are moist.     Pharynx: Oropharynx is clear.   Cardiovascular:     Rate and Rhythm: Regular rhythm. Tachycardia present.  Pulmonary:     Effort: Pulmonary effort is normal.     Breath sounds: No rhonchi or rales.  Abdominal:     General: Abdomen is flat. Bowel sounds are normal. There is no distension.     Palpations: Abdomen is soft.     Tenderness: There is no abdominal tenderness.   Musculoskeletal:      Right lower leg: No edema.     Left lower leg: No edema.     Comments: No gross deformity of the left shoulder. Decrease ROM of the L shoulder with extension and  abduction past 90 degrees, normal internal and external rotation, Pain with empty can and hawkins, normal distal pulses, strength, and sensation   Skin:    General: Skin is warm and dry.     Capillary Refill: Capillary refill takes less than 2 seconds.   Neurological:     General: No focal deficit present.     Mental Status: He is alert and oriented to person, place, and time.   Psychiatric:        Mood and Affect: Mood normal.        Behavior: Behavior normal.        03/05/2024    8:50 AM 01/14/2024    4:05 PM 07/17/2023   10:08 AM  Depression screen PHQ 2/9  Decreased Interest 0    Down, Depressed, Hopeless 0    PHQ - 2 Score 0       Information is confidential and restricted. Go to Review Flowsheets to unlock data.       03/05/2024    8:51 AM  01/14/2024    4:05 PM 07/17/2023   10:09 AM 04/21/2023    1:33 PM  GAD 7 : Generalized Anxiety Score  Nervous, Anxious, on Edge 0   0  Control/stop worrying 0   0  Worry too much - different things 0   0  Trouble relaxing 0   0  Restless 0   0  Easily annoyed or irritable 0   0  Afraid - awful might happen 0   0  Total GAD 7 Score 0   0  Anxiety Difficulty Not difficult at all   Not difficult at all     Information is confidential and restricted. Go to Review Flowsheets to unlock data.    No results found for any visits on 03/26/24.  Last CBC Lab Results  Component Value Date   WBC 3.6 01/13/2023   HGB 17.1 01/13/2023   HCT 51.2 (H) 01/13/2023   MCV 90 01/13/2023   MCH 29.9 01/13/2023   RDW 12.6 01/13/2023   PLT 126 (L) 01/13/2023   Last metabolic panel Lab Results  Component Value Date   GLUCOSE 92 04/21/2023   NA 140 04/21/2023   K 5.0 04/21/2023   CL 100 04/21/2023   CO2 23 04/21/2023   BUN 13 04/21/2023   CREATININE 1.41 (H) 04/21/2023   EGFR 57  (L) 04/21/2023   CALCIUM 10.3 (H) 04/21/2023   PROT 7.7 01/13/2023   ALBUMIN 5.0 (H) 01/13/2023   LABGLOB 2.7 01/13/2023   AGRATIO 1.9 01/13/2023   BILITOT 0.3 01/13/2023   ALKPHOS 53 01/13/2023   AST 13 01/13/2023   ALT 16 01/13/2023      The 10-year ASCVD risk score (Arnett DK, et al., 2019) is: 8.9%    Assessment & Plan:  Acute pain of left shoulder Will check shoulder imaging for fracture given traumatic fall. May be developing adhesive capsulitis given last few weeks of immobility vs shoulder impugnment. Would like to avoid systemic NSAIDs given underlying kidney disease. Discussed shoulder exercises for mobility. Discussed making appointment to Sport Medicine as he may benefit from shoulder injection for pain.  -     DG Shoulder Left    Return if symptoms worsen or fail to improve.    Barnetta Liberty, MD

## 2024-03-29 ENCOUNTER — Ambulatory Visit: Payer: Self-pay | Admitting: Student

## 2024-03-31 ENCOUNTER — Ambulatory Visit

## 2024-04-01 ENCOUNTER — Encounter: Payer: Self-pay | Admitting: Family Medicine

## 2024-04-01 ENCOUNTER — Encounter: Admitting: Family Medicine

## 2024-04-01 ENCOUNTER — Ambulatory Visit (INDEPENDENT_AMBULATORY_CARE_PROVIDER_SITE_OTHER): Admitting: Family Medicine

## 2024-04-01 VITALS — BP 112/68 | HR 89 | Ht 69.0 in | Wt 156.2 lb

## 2024-04-01 DIAGNOSIS — M7502 Adhesive capsulitis of left shoulder: Secondary | ICD-10-CM | POA: Diagnosis not present

## 2024-04-01 DIAGNOSIS — M19012 Primary osteoarthritis, left shoulder: Secondary | ICD-10-CM | POA: Diagnosis not present

## 2024-04-01 MED ORDER — PREDNISONE 20 MG PO TABS: 20.0000 mg | ORAL_TABLET | Freq: Two times a day (BID) | ORAL | 0 refills | Status: AC

## 2024-04-01 MED ORDER — METHOCARBAMOL 500 MG PO TABS: 500.0000 mg | ORAL_TABLET | Freq: Three times a day (TID) | ORAL | 0 refills | Status: DC | PRN

## 2024-04-01 NOTE — Assessment & Plan Note (Signed)
 History of Present Illness Randall Simon is a 63 year old male with kidney disease who presents with left shoulder pain following a fall. He is accompanied by his sister, Randall Simon. He was referred by Dr. Cari Char for evaluation of his shoulder pain.  He has been experiencing left shoulder pain that began immediately following a fall approximately five weeks ago. The pain is localized to the left shoulder and arm, starting right after he fell and used his arm to break the fall. This has resulted in limited motion of the shoulder, which was immediate and has persisted since the incident.  An x-ray of the left shoulder showed mild arthritis and os acromiale, but no fractures. Significant limitations in range of motion, particularly with abduction and forward flexion of the left shoulder, are noted. Pain is exacerbated by certain movements.  He was previously prescribed a topical cream for pain relief, but it has not provided any relief. Due to his kidney disease, he avoids NSAIDs.  No issues with the right shoulder, although some stiffness was noted. He has not engaged in any physical therapy previously and does not drive, relying on his sister for transportation.  Physical Exam RANGE OF MOTION: Forward flexion limited bilaterally, more pronounced on the left. Right shoulder deficit of approximately 10-15 degrees from overhead. Left shoulder flexion limited to 5 degrees from 90 degrees. Bilateral limited range of motion with active abduction, potential for full abduction on right side. Right internal rotation and extension allow reaching to the upper to mid scapular region. Left internal rotation and extension allow reaching to the mid to upper lumbar region. STRENGTH: External rotation strength is 5/5 bilaterally with associated shoulder pain. Internal rotation strength is 5/5 bilaterally, painless. Left supraspinatus strength is 4/5 with maximum pain, right side is benign. SPECIAL TESTS: Positive Neer's,  positive Hawkins, positive speeds, positive Yergason's, positive glenohumeral load, crepitus with passive ROM.  Left shoulder pain with rotator cuff involvement Likely due to mild wear and tear and a fall. Limited range of motion and pain in supraspinatus suggest small tear or inflamed tendon. No full tear. Possible adhesive capsulitis. - Initiated home health physical therapy for function and strength. - Prescribed muscle relaxer TID PRN, caution for drowsiness. - Prescribed oral prednisone  BID for 7 days. - Continue topical Voltaren  gel QID. - Advised use of shoulder for daily tasks, avoid strenuous activities. - Consider corticosteroid injection(s) if still rather symptomatic and return  Adhesive capsulitis of left shoulder Secondary to inflammation and limited movement post-fall. Limited range of motion and positive capsular involvement. - Initiated home health physical therapy for range of motion. - Prescribed oral prednisone  to reduce inflammation. - Advised maintaining shoulder movement for daily activities.

## 2024-04-01 NOTE — Patient Instructions (Signed)
 Patient Action Plan  1. Shoulder Pain Management:    - Start home health physical therapy to improve shoulder function and strength.    - Take prednisone  (20 mg) twice daily with meals for 7 days to reduce inflammation.    - Use methocarbamol (500 mg) every 8 hours as needed for muscle spasms; be cautious of drowsiness.    - Apply Voltaren  gel to the shoulder four times daily for pain relief.    - Maintain shoulder movement for daily activities, but avoid strenuous activities.  2. Monitoring and Follow-up:    - Monitor the response to the oral steroids and physical therapy.    - Consider corticosteroid injections if symptoms persist.  Red Flags: - Contact your healthcare provider if you experience increased pain or if there is no improvement in shoulder movement after completing the treatment plan.

## 2024-04-01 NOTE — Assessment & Plan Note (Addendum)
 See additional assessment(s) for plan details.  Mild arthritis of left shoulder Identified on x-ray, likely exacerbated by fall, contributing to pain and stiffness. - Continue topical Voltaren  gel for pain management. - Monitor response to oral steroids and home health physical therapy.

## 2024-04-01 NOTE — Progress Notes (Signed)
 Primary Care / Sports Medicine Office Visit  Patient Information:  Patient ID: Randall Simon, male DOB: 1960/11/25 Age: 63 y.o. MRN: [id number]   Randall Simon is a pleasant 63 y.o. male presenting with the following:  Chief Complaint  Patient presents with   Shoulder Pain    Left shoulder pain since fall. Constant ache through his arm to bicep area. Discuss cortisone injection. Patient taking extra strength tylenol for pain. Carrying is a aggravating factor for him. Patent does have xray's for today's visit.     Vitals:   04/01/24 1017  BP: 112/68  Pulse: 89  SpO2: 97%   Vitals:   04/01/24 1017  Weight: 156 lb 3.2 oz (70.9 kg)  Height: 5' 9 (1.753 m)   Body mass index is 23.07 kg/m.  DG Shoulder Left Result Date: 03/28/2024 CLINICAL DATA:  Shoulder pain for longer than 1 month. EXAM: LEFT SHOULDER - 2+ VIEW COMPARISON:  None Available. FINDINGS: Mild glenohumeral joint space narrowing. Mild inferior and posterior glenoid degenerative osteophytosis. 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 Otherwise, normal alignment of the acromioclavicular joint with mild peripheral osteophytosis. No acute fracture or dislocation. IMPRESSION: 1. Mild glenohumeral and acromioclavicular osteoarthritis. 2. 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 Electronically Signed   By: Bertina Broccoli M.D.   On: 03/28/2024 15:56     Independent interpretation of notes and tests performed by another provider:   None  Procedures performed:   None  Pertinent History, Exam, Impression, and Recommendations:   Problem List Items Addressed This Visit     Adhesive capsulitis of left shoulder - Primary   History of Present Illness Randall Simon is a 63 year old male with kidney disease who presents with left shoulder pain  following a fall. He is accompanied by his sister, Oakley Bellman. He was referred by Dr. Cari Char for evaluation of his shoulder pain.  He has been experiencing left shoulder pain that began immediately following a fall approximately five weeks ago. The pain is localized to the left shoulder and arm, starting right after he fell and used his arm to break the fall. This has resulted in limited motion of the shoulder, which was immediate and has persisted since the incident.  An x-ray of the left shoulder showed mild arthritis and os acromiale, but no fractures. Significant limitations in range of motion, particularly with abduction and forward flexion of the left shoulder, are noted. Pain is exacerbated by certain movements.  He was previously prescribed a topical cream for pain relief, but it has not provided any relief. Due to his kidney disease, he avoids NSAIDs.  No issues with the right shoulder, although some stiffness was noted. He has not engaged in any physical therapy previously and does not drive, relying on his sister for transportation.  Physical Exam RANGE OF MOTION: Forward flexion limited bilaterally, more pronounced on the left. Right shoulder deficit of approximately 10-15 degrees from overhead. Left shoulder flexion limited to 5 degrees from 90 degrees. Bilateral limited range of motion with active abduction, potential for full abduction on right side. Right internal rotation and extension allow reaching to the upper to mid scapular region. Left internal rotation and extension allow reaching to the mid to upper lumbar region. STRENGTH: External rotation strength is 5/5 bilaterally with associated shoulder pain. Internal rotation strength is 5/5 bilaterally, painless.  Left supraspinatus strength is 4/5 with maximum pain, right side is benign. SPECIAL TESTS: Positive Neer's, positive Hawkins, positive speeds, positive Yergason's, positive glenohumeral load, crepitus with passive ROM.  Left  shoulder pain with rotator cuff involvement Likely due to mild wear and tear and a fall. Limited range of motion and pain in supraspinatus suggest small tear or inflamed tendon. No full tear. Possible adhesive capsulitis. - Initiated home health physical therapy for function and strength. - Prescribed muscle relaxer TID PRN, caution for drowsiness. - Prescribed oral prednisone  BID for 7 days. - Continue topical Voltaren  gel QID. - Advised use of shoulder for daily tasks, avoid strenuous activities. - Consider corticosteroid injection(s) if still rather symptomatic and return  Adhesive capsulitis of left shoulder Secondary to inflammation and limited movement post-fall. Limited range of motion and positive capsular involvement. - Initiated home health physical therapy for range of motion. - Prescribed oral prednisone  to reduce inflammation. - Advised maintaining shoulder movement for daily activities.       Relevant Medications   predniSONE  (DELTASONE ) 20 MG tablet   methocarbamol (ROBAXIN) 500 MG tablet   Other Relevant Orders   Ambulatory referral to Home Health   Osteoarthritis of glenohumeral joint, left   See additional assessment(s) for plan details.  Mild arthritis of left shoulder Identified on x-ray, likely exacerbated by fall, contributing to pain and stiffness. - Continue topical Voltaren  gel for pain management. - Monitor response to oral steroids and home health physical therapy.      Relevant Medications   predniSONE  (DELTASONE ) 20 MG tablet   methocarbamol (ROBAXIN) 500 MG tablet   Other Relevant Orders   Ambulatory referral to Home Health     Orders & Medications Medications:  Meds ordered this encounter  Medications   predniSONE  (DELTASONE ) 20 MG tablet    Sig: Take 1 tablet (20 mg total) by mouth 2 (two) times daily with a meal for 7 days.    Dispense:  14 tablet    Refill:  0   methocarbamol (ROBAXIN) 500 MG tablet    Sig: Take 1 tablet (500 mg total)  by mouth every 8 (eight) hours as needed for muscle spasms.    Dispense:  30 tablet    Refill:  0   Orders Placed This Encounter  Procedures   Ambulatory referral to Home Health     Return in about 10 days (around 04/11/2024) for 40 min procedure.     Ma Saupe, MD, [facility]   Primary Care Sports Medicine Primary Care and Sports Medicine at [facility]

## 2024-04-09 ENCOUNTER — Telehealth: Payer: Self-pay

## 2024-04-09 NOTE — Telephone Encounter (Signed)
 Copied from CRM [id number]. Topic: Clinical - Home Health Verbal Orders >> Apr 09, 2024  3:16 PM Sophia H wrote: Caller/Agency: [facility] Callback Number: [phone] (voicemail secured) Service Requested: Physical Therapy Frequency: 1 week 9  Any new concerns about the patient? No

## 2024-04-12 NOTE — Telephone Encounter (Signed)
 Left VM for verbal.  - Naysha Sholl M.

## 2024-04-15 ENCOUNTER — Ambulatory Visit (INDEPENDENT_AMBULATORY_CARE_PROVIDER_SITE_OTHER): Admitting: Family Medicine

## 2024-04-15 ENCOUNTER — Other Ambulatory Visit (INDEPENDENT_AMBULATORY_CARE_PROVIDER_SITE_OTHER): Payer: Self-pay | Admitting: Radiology

## 2024-04-15 VITALS — BP 108/74 | HR 84 | Ht 69.0 in | Wt 154.8 lb

## 2024-04-15 DIAGNOSIS — M7502 Adhesive capsulitis of left shoulder: Secondary | ICD-10-CM

## 2024-04-15 DIAGNOSIS — M19012 Primary osteoarthritis, left shoulder: Secondary | ICD-10-CM

## 2024-04-15 MED ORDER — TRIAMCINOLONE ACETONIDE 40 MG/ML IJ SUSP
40.0000 mg | Freq: Once | INTRAMUSCULAR | Status: AC
  Administered 2024-04-15: 40 mg via INTRAMUSCULAR

## 2024-04-19 ENCOUNTER — Encounter: Payer: Self-pay | Admitting: Family Medicine

## 2024-04-19 NOTE — Assessment & Plan Note (Signed)
 Adhesive capsulitis of shoulder Potential concern for adhesive capsulitis if no improvement post-injection. - Monitor shoulder improvement over the next two weeks. - Consider capsular dilation injection procedure if no improvement.

## 2024-04-19 NOTE — Progress Notes (Signed)
 Primary Care / Sports Medicine Office Visit  Patient Information:  Patient ID: Randall Simon, male DOB: 07-19-1961 Age: 63 y.o. MRN: [id number]   Randall Simon is a pleasant 63 y.o. male presenting with the following:  Chief Complaint  Patient presents with   Shoulder Pain    Patient presents today for left shoulder corticosteroid injection.    Vitals:   04/15/24 1605  BP: 108/74  Pulse: 84  SpO2: 99%   Vitals:   04/15/24 1605  Weight: 154 lb 12.8 oz (70.2 kg)  Height: 5' 9 (1.753 m)   Body mass index is 22.86 kg/m.  DG Shoulder Left Result Date: 03/28/2024 CLINICAL DATA:  Shoulder pain for longer than 1 month. EXAM: LEFT SHOULDER - 2+ VIEW COMPARISON:  None Available. FINDINGS: Mild glenohumeral joint space narrowing. Mild inferior and posterior glenoid degenerative osteophytosis. 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 Otherwise, normal alignment of the acromioclavicular joint with mild peripheral osteophytosis. No acute fracture or dislocation. IMPRESSION: 1. Mild glenohumeral and acromioclavicular osteoarthritis. 2. 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 Electronically Signed   By: Tanda Lyons M.D.   On: 03/28/2024 15:56     Independent interpretation of notes and tests performed by another provider:   Reviewed with patient today.  Procedures performed:   Procedure:  Injection of left shoulder under ultrasound guidance. Ultrasound guidance utilized for left glenohumeral joint injection, dynamic motion noted Samsung HS60 device utilized with permanent recording / reporting. Verbal informed consent obtained and verified. Skin prepped in a sterile fashion. Ethyl chloride for topical local analgesia.  Completed without difficulty and tolerated well. Medication: triamcinolone  acetonide 40  mg/mL suspension for injection 1 mL total and 2 mL lidocaine 1% without epinephrine utilized for needle placement anesthetic Advised to contact for fevers/chills, erythema, induration, drainage, or persistent bleeding.   Pertinent History, Exam, Impression, and Recommendations:   Problem List Items Addressed This Visit     Adhesive capsulitis of left shoulder   Adhesive capsulitis of shoulder Potential concern for adhesive capsulitis if no improvement post-injection. - Monitor shoulder improvement over the next two weeks. - Consider capsular dilation injection procedure if no improvement.      Osteoarthritis of glenohumeral joint, left - Primary   History of Present Illness Randall Simon is a 63 year old male who presents with shoulder pain.  Shoulder pain - Persistent shoulder pain since prior visit on June 19th - Pain is exacerbated by certain movements - Partial relief with prednisone  and muscle relaxer - No prior cortisone injections  Medication use - Currently using muscle relaxer as needed with beneficial effect - Completed course of prednisone  with some symptom improvement  Physical therapy - Participating in home-based physical therapy initiated last week - Therapy sessions were adjusted to accommodate today's checkup - Plans to resume physical therapy next week  Physical Exam RANGE OF MOTION: Painful limited PROM.  Chronic shoulder pain Chronic shoulder pain with limited range of motion and persistent inflammation despite previous treatment. - Administer cortisone injection into the shoulder joint using ultrasound guidance. - Apply ice post-injection and before bedtime. Avoid cream at the injection site until healed. - Avoid heavy lifting for two days post-injection. Normal activities permissible. - Continue Voltaren  gel for pain relief, avoiding the injection site until healed. - Continue home-based physical therapy. Resume physical therapy next week. -  Reassess shoulder condition in two weeks via MyChart or phone call. Consider further evaluation for adhesive capsulitis if no improvement.      Relevant Orders   US  LIMITED JOINT SPACE STRUCTURES UP LEFT     Orders & Medications Medications:  Meds ordered this encounter  Medications   triamcinolone  acetonide (KENALOG -40) injection 40 mg   Orders Placed This Encounter  Procedures   US  LIMITED JOINT SPACE STRUCTURES UP LEFT     No follow-ups on file.     Selinda JINNY Ku, MD, [facility]   Primary Care Sports Medicine Primary Care and Sports Medicine at [facility]

## 2024-04-19 NOTE — Assessment & Plan Note (Signed)
 History of Present Illness Randall Simon is a 64 year old male who presents with shoulder pain.  Shoulder pain - Persistent shoulder pain since prior visit on June 19th - Pain is exacerbated by certain movements - Partial relief with prednisone  and muscle relaxer - No prior cortisone injections  Medication use - Currently using muscle relaxer as needed with beneficial effect - Completed course of prednisone  with some symptom improvement  Physical therapy - Participating in home-based physical therapy initiated last week - Therapy sessions were adjusted to accommodate today's checkup - Plans to resume physical therapy next week  Physical Exam RANGE OF MOTION: Painful limited PROM.  Chronic shoulder pain Chronic shoulder pain with limited range of motion and persistent inflammation despite previous treatment. - Administer cortisone injection into the shoulder joint using ultrasound guidance. - Apply ice post-injection and before bedtime. Avoid cream at the injection site until healed. - Avoid heavy lifting for two days post-injection. Normal activities permissible. - Continue Voltaren  gel for pain relief, avoiding the injection site until healed. - Continue home-based physical therapy. Resume physical therapy next week. - Reassess shoulder condition in two weeks via MyChart or phone call. Consider further evaluation for adhesive capsulitis if no improvement.

## 2024-04-19 NOTE — Patient Instructions (Signed)
 Patient Plan for Post-Visit Guidance  1. Cortisone Injection:    - A cortisone injection was administered into the shoulder joint.    - Apply ice to the shoulder after the injection and before bedtime.    - Avoid using cream on the injection site until it is healed.  2. Activity Restrictions:    - Avoid heavy lifting for two days after the injection.    - You may resume normal activities after two days.  3. Pain Management:    - Continue using Voltaren  gel for pain relief, but avoid applying it to the injection site until it is healed.  4. Physical Therapy:    - Continue with home-based physical therapy.    - Resume regular physical therapy sessions next week.  5. Monitoring and Follow-Up:    - Monitor shoulder improvement over the next two weeks.    - Reassess shoulder condition in two weeks via MyChart or phone call.    - If there is no improvement, further evaluation for adhesive capsulitis may be considered, including a capsular dilation injection procedure.

## 2024-05-05 ENCOUNTER — Ambulatory Visit (INDEPENDENT_AMBULATORY_CARE_PROVIDER_SITE_OTHER): Admitting: Emergency Medicine

## 2024-05-05 VITALS — Ht 71.0 in | Wt 154.0 lb

## 2024-05-05 DIAGNOSIS — Z1211 Encounter for screening for malignant neoplasm of colon: Secondary | ICD-10-CM

## 2024-05-05 DIAGNOSIS — Z0000 Encounter for general adult medical examination without abnormal findings: Secondary | ICD-10-CM

## 2024-05-05 NOTE — Progress Notes (Signed)
 Subjective:   Randall Simon is a 63 y.o. who presents for a Medicare Wellness preventive visit.  As a reminder, Annual Wellness Visits don't include a physical exam, and some assessments may be limited, especially if this visit is performed virtually. We may recommend an in-person follow-up visit with your provider if needed.  Visit Complete: Virtual I connected with  Randall Simon on 05/05/24 by a audio enabled telemedicine application and verified that I am speaking with the correct person using two identifiers.  Patient Location: Home  Provider Location: Home Office  I discussed the limitations of evaluation and management by telemedicine. The patient expressed understanding and agreed to proceed.  Vital Signs: Because this visit was a virtual/telehealth visit, some criteria may be missing or patient reported. Any vitals not documented were not able to be obtained and vitals that have been documented are patient reported.  VideoDeclined- This patient declined Librarian, academic. Therefore the visit was completed with audio only.  Persons Participating in Visit: Patient assisted by Randall Simon, sister.  AWV Questionnaire: No: Patient Medicare AWV questionnaire was not completed prior to this visit.  Cardiac Risk Factors include: advanced age (>83men, >56 women);male gender     Objective:    Today's Vitals   05/05/24 0921  Weight: 154 lb (69.9 kg)  Height: 5' 11 (1.803 m)   Body mass index is 21.48 kg/m.     05/05/2024    9:37 AM 01/13/2023    3:00 PM 12/01/2018    4:02 PM  Advanced Directives  Does Patient Have a Medical Advance Directive? Yes No   Type of Estate agent of [place];Living will    Does patient want to make changes to medical advance directive? No - Patient declined    Copy of Healthcare Power of Attorney in Chart? No - copy requested       Information is confidential and restricted. Go to Review  Flowsheets to unlock data.    Current Medications (verified) Outpatient Encounter Medications as of 05/05/2024  Medication Sig   ARIPiprazole  (ABILIFY ) 15 MG tablet Take 1 tablet (15 mg total) by mouth daily.   ascorbic acid (VITAMIN C) 100 MG tablet Take by mouth.   Cholecalciferol (VITAMIN D-3 PO) Take 50 mcg by mouth.   diclofenac  Sodium (VOLTAREN ) 1 % GEL Apply 2 g topically 4 (four) times daily. Use on affected joint up to 4x/day as needed. 2 grams is roughly 4 fingertips' worth of gel.   famotidine  (PEPCID ) 20 MG tablet Take 1 tablet (20 mg total) by mouth daily.   vitamin B-12 (CYANOCOBALAMIN) 1000 MCG tablet Take by mouth.   methocarbamol  (ROBAXIN ) 500 MG tablet Take 1 tablet (500 mg total) by mouth every 8 (eight) hours as needed for muscle spasms. (Patient not taking: Reported on 05/05/2024)   No facility-administered encounter medications on file as of 05/05/2024.    Allergies (verified) Patient has no known allergies.   History: Past Medical History:  Diagnosis Date   Chronic kidney disease    GERD (gastroesophageal reflux disease)    Head injury    Schizophrenia (HCC)    Past Surgical History:  Procedure Laterality Date   head surgery     Family History  Problem Relation Age of Onset   Hypertension Mother    Suicidality Mother    Heart attack Father    Social History   Socioeconomic History   Marital status: Single    Spouse name: Not on file  Number of children: 0   Years of education: Not on file   Highest education level: Not on file  Occupational History   Occupation: disability  Tobacco Use   Smoking status: Never   Smokeless tobacco: Never  Vaping Use   Vaping status: Never Used  Substance and Sexual Activity   Alcohol use: No   Drug use: No   Sexual activity: Not Currently  Other Topics Concern   Not on file  Social History Narrative   Not on file   Social Drivers of Health   Financial Resource Strain: Low Risk  (05/05/2024)   Overall  Financial Resource Strain (CARDIA)    Difficulty of Paying Living Expenses: Not hard at all  Food Insecurity: No Food Insecurity (05/05/2024)   Hunger Vital Sign    Worried About Running Out of Food in the Last Year: Never true    Ran Out of Food in the Last Year: Never true  Transportation Needs: No Transportation Needs (05/05/2024)   PRAPARE - Administrator, Civil Service (Medical): No    Lack of Transportation (Non-Medical): No  Physical Activity: Insufficiently Active (05/05/2024)   Exercise Vital Sign    Days of Exercise per Week: 7 days    Minutes of Exercise per Session: 10 min  Stress: No Stress Concern Present (05/05/2024)   [employer] of Occupational Health - Occupational Stress Questionnaire    Feeling of Stress: Not at all  Social Connections: Moderately Isolated (05/05/2024)   Social Connection and Isolation Panel    Frequency of Communication with Friends and Family: Three times a week    Frequency of Social Gatherings with Friends and Family: More than three times a week    Attends Religious Services: 1 to 4 times per year    Active Member of [employer] or Organizations: No    Attends Engineer, structural: Never    Marital Status: Never married    Tobacco Counseling Counseling given: Not Answered    Clinical Intake:  Pre-visit preparation completed: Yes  Pain : No/denies pain     BMI - recorded: 21.48 Nutritional Status: BMI of 19-24  Normal Nutritional Risks: None Diabetes: No  Lab Results  Component Value Date   HGBA1C 5.6 12/28/2020     How often do you need to have someone help you when you read instructions, pamphlets, or other written materials from your doctor or pharmacy?: 1 - Never  Interpreter Needed?: No  Information entered by :: Vina Ned, CMA   Activities of Daily Living     05/05/2024    9:25 AM  In your present state of health, do you have any difficulty performing the following activities:  Hearing? 0   Vision? 0  Difficulty concentrating or making decisions? 0  Walking or climbing stairs? 0  Dressing or bathing? 0  Doing errands, shopping? 1  Comment family takes to appointments  Preparing Food and eating ? N  Comment also gets meals on wheels  Using the Toilet? N  In the past six months, have you accidently leaked urine? N  Do you have problems with loss of bowel control? N  Managing your Medications? Y  Comment Randall Simon, sister fills pill box  Managing your Finances? Randall Simon, sister Neurosurgeon or managing your Housekeeping? N    Patient Care Team: Manya Toribio SQUIBB, PA as PCP - General (Physician Assistant) Coby Height, MD as Consulting Physician (Psychiatry) Henriette Clotilda CROME, MD as Consulting Physician (  Nephrology)  I have updated your Care Teams any recent Medical Services you may have received from other providers in the past year.     Assessment:   This is a routine wellness examination for [place].  Hearing/Vision screen Hearing Screening - Comments:: Denies hearing loss  Vision Screening - Comments:: Gets routine eye exams, [facility] [place]   Goals Addressed               This Visit's Progress     DIET - INCREASE WATER INTAKE (pt-stated)         Depression Screen     05/05/2024    9:34 AM 04/15/2024    4:08 PM 03/05/2024    8:50 AM 01/14/2024    4:05 PM 07/17/2023   10:08 AM 04/21/2023    1:32 PM 01/13/2023    2:46 PM  PHQ 2/9 Scores  PHQ - 2 Score 0 0 0   0 0  PHQ- 9 Score 0     0 0     Information is confidential and restricted. Go to Review Flowsheets to unlock data.    Fall Risk     05/05/2024    9:40 AM 04/15/2024    4:08 PM 03/05/2024    8:50 AM 04/21/2023    1:33 PM 01/13/2023    2:47 PM  Fall Risk   Falls in the past year? 1 1 1  0 0  Number falls in past yr: 0 0 0 0 0  Injury with Fall? 1 0 0 0 0  Risk for fall due to : History of fall(s);Impaired balance/gait;Orthopedic patient No Fall Risks History of  fall(s) No Fall Risks No Fall Risks  Follow up Falls evaluation completed;Education provided Falls evaluation completed Falls evaluation completed Falls evaluation completed Falls evaluation completed    MEDICARE RISK AT HOME:  Medicare Risk at Home Any stairs in or around the home?: Yes If so, are there any without handrails?: No Home free of loose throw rugs in walkways, pet beds, electrical cords, etc?: Yes Adequate lighting in your home to reduce risk of falls?: Yes Life alert?: No Use of a cane, walker or w/c?: No Grab bars in the bathroom?: Yes Shower chair or bench in shower?: Yes Elevated toilet seat or a handicapped toilet?: No  TIMED UP AND GO:  Was the test performed?  No  Cognitive Function: 6CIT completed        05/05/2024    9:43 AM 01/13/2023    3:01 PM  6CIT Screen  What Year? 0 points 0 points  What month? 0 points 0 points  What time? 3 points 0 points  Count back from 20 0 points 0 points  Months in reverse 0 points 0 points  Repeat phrase 0 points 0 points  Total Score 3 points 0 points    Immunizations Immunization History  Administered Date(s) Administered   Influenza Inj Mdck Quad Pf 08/08/2017, 06/28/2021   Influenza,inj,Quad PF,6+ Mos 09/15/2014, 08/21/2015, 08/23/2016, 07/28/2017, 07/22/2018, 06/24/2019, 06/23/2020, 07/11/2022   Influenza,inj,quad, With Preservative 11/27/2020   Influenza-Unspecified 09/15/2014, 08/21/2015, 07/22/2018   Moderna Sars-Covid-2 Vaccination 08/15/2020   Td 12/21/2019   Tdap 10/21/2008   Zoster Recombinant(Shingrix) 09/21/2020, 11/24/2020    Screening Tests Health Maintenance  Topic Date Due   COVID-19 Vaccine (2 - Moderna risk series) 09/12/2020   Colonoscopy  06/02/2024   INFLUENZA VACCINE  05/14/2024   Medicare Annual Wellness (AWV)  05/05/2025   DTaP/Tdap/Td (3 - Td or Tdap) 12/20/2029   Hepatitis  C Screening  Completed   HIV Screening  Completed   Zoster Vaccines- Shingrix  Completed   Hepatitis B  Vaccines  Aged Out   HPV VACCINES  Aged Out   Meningococcal B Vaccine  Aged Out    Health Maintenance  Health Maintenance Due  Topic Date Due   COVID-19 Vaccine (2 - Moderna risk series) 09/12/2020   Colonoscopy  06/02/2024   Health Maintenance Items Addressed: Referral sent to GI for colonoscopy, See Nurse Notes at the end of this note  Additional Screening:  Vision Screening: Recommended annual ophthalmology exams for early detection of glaucoma and other disorders of the eye. Would you like a referral to an eye doctor? No    Dental Screening: Recommended annual dental exams for proper oral hygiene  Community Resource Referral / Chronic Care Management: CRR required this visit?  No   CCM required this visit?  No   Plan:    I have personally reviewed and noted the following in the patient's chart:   Medical and social history Use of alcohol, tobacco or illicit drugs  Current medications and supplements including opioid prescriptions. Patient is not currently taking opioid prescriptions. Functional ability and status Nutritional status Physical activity Advanced directives List of other physicians Hospitalizations, surgeries, and ER visits in previous 12 months Vitals Screenings to include cognitive, depression, and falls Referrals and appointments  In addition, I have reviewed and discussed with patient certain preventive protocols, quality metrics, and best practice recommendations. A written personalized care plan for preventive services as well as general preventive health recommendations were provided to patient.   Vina Ned, CMA   05/05/2024   After Visit Summary: (MyChart) Due to this being a telephonic visit, the after visit summary with patients personalized plan was offered to patient via MyChart   Notes: Randall Simon, patient's sister assisted with today's visit. 6 CIT Score - 3 Placed referral to [facility] for colonoscopy Declined Covid  vaccines

## 2024-05-05 NOTE — Patient Instructions (Signed)
 Randall Simon , Thank you for taking time out of your busy schedule to complete your Annual Wellness Visit with me. I enjoyed our conversation and look forward to speaking with you again next year. I, as well as your care team,  appreciate your ongoing commitment to your health goals. Please review the following plan we discussed and let me know if I can assist you in the future. Your Game plan/ To Do List    Referrals: I have placed a referral to [facility] for a colonoscopy. Phone# [phone]. If you haven't heard from the office you've been referred to, please reach out to them at the phone provided.    Follow up Visits: Next Medicare AWV with our clinical staff: 05/11/25 @ 9:30am (VIDEO VISIT)   Have you seen your provider in the last 6 months (3 months if uncontrolled diabetes)? Yes Next Office Visit with your provider: 06/02/24 @ 10:40 with Toribio Hoyle, PA  Clinician Recommendations: Get the flu shot in the fall.  Aim for 30 minutes of exercise or brisk walking, 6-8 glasses of water, and 5 servings of fruits and vegetables each day.       This is a list of the screening recommended for you and due dates:  Health Maintenance  Topic Date Due   COVID-19 Vaccine (2 - Moderna risk series) 09/12/2020   Colon Cancer Screening  06/02/2024   Flu Shot  05/14/2024   Medicare Annual Wellness Visit  05/05/2025   DTaP/Tdap/Td vaccine (3 - Td or Tdap) 12/20/2029   Hepatitis C Screening  Completed   HIV Screening  Completed   Zoster (Shingles) Vaccine  Completed   Hepatitis B Vaccine  Aged Out   HPV Vaccine  Aged Out   Meningitis B Vaccine  Aged Out    Advanced directives: (Copy Requested) Please bring a copy of your health care power of attorney and living will to the office to be added to your chart at your convenience. You can mail to [facility] [address] [place], [place] [address] or email to [email or web address] Advance Care Planning is important  because it:  [x]  Makes sure you receive the medical care that is consistent with your values, goals, and preferences  [x]  It provides guidance to your family and loved ones and reduces their decisional burden about whether or not they are making the right decisions based on your wishes.  Follow the link provided in your after visit summary or read over the paperwork we have mailed to you to help you started getting your Advance Directives in place. If you need assistance in completing these, please reach out to us  so that we can help you!  See attachments for Preventive Care and Fall Prevention Tips.   Fall Prevention in the Home, Adult Falls can cause injuries and affect people of all ages. There are many simple things that you can do to make your home safe and to help prevent falls. If you need it, ask for help making these changes. What actions can I take to prevent falls? General information Use good lighting in all rooms. Make sure to: Replace any light bulbs that burn out. Turn on lights if it is dark and use night-lights. Keep items that you use often in easy-to-reach places. Lower the shelves around your home if needed. Move furniture so that there are clear paths around it. Do not keep throw rugs or other things on the floor that can make you trip. If  any of your floors are uneven, fix them. Add color or contrast paint or tape to clearly mark and help you see: Grab bars or handrails. First and last steps of staircases. Where the edge of each step is. If you use a ladder or stepladder: Make sure that it is fully opened. Do not climb a closed ladder. Make sure the sides of the ladder are locked in place. Have someone hold the ladder while you use it. Know where your pets are as you move through your home. What can I do in the bathroom?     Keep the floor dry. Clean up any water that is on the floor right away. Remove soap buildup in the bathtub or shower. Buildup makes  bathtubs and showers slippery. Use non-skid mats or decals on the floor of the bathtub or shower. Attach bath mats securely with double-sided, non-slip rug tape. If you need to sit down while you are in the shower, use a non-slip stool. Install grab bars by the toilet and in the bathtub and shower. Do not use towel bars as grab bars. What can I do in the bedroom? Make sure that you have a light by your bed that is easy to reach. Do not use any sheets or blankets on your bed that hang to the floor. Have a firm bench or chair with side arms that you can use for support when you get dressed. What can I do in the kitchen? Clean up any spills right away. If you need to reach something above you, use a sturdy step stool that has a grab bar. Keep electrical cables out of the way. Do not use floor polish or wax that makes floors slippery. What can I do with my stairs? Do not leave anything on the stairs. Make sure that you have a light switch at the top and the bottom of the stairs. Have them installed if you do not have them. Make sure that there are handrails on both sides of the stairs. Fix handrails that are broken or loose. Make sure that handrails are as long as the staircases. Install non-slip stair treads on all stairs in your home if they do not have carpet. Avoid having throw rugs at the top or bottom of stairs, or secure the rugs with carpet tape to prevent them from moving. Choose a carpet design that does not hide the edge of steps on the stairs. Make sure that carpet is firmly attached to the stairs. Fix any carpet that is loose or worn. What can I do on the outside of my home? Use bright outdoor lighting. Repair the edges of walkways and driveways and fix any cracks. Clear paths of anything that can make you trip, such as tools or rocks. Add color or contrast paint or tape to clearly mark and help you see high doorway thresholds. Trim any bushes or trees on the main path into your  home. Check that handrails are securely fastened and in good repair. Both sides of all steps should have handrails. Install guardrails along the edges of any raised decks or porches. Have leaves, snow, and ice cleared regularly. Use sand, salt, or ice melt on walkways during winter months if you live where there is ice and snow. In the garage, clean up any spills right away, including grease or oil spills. What other actions can I take? Review your medicines with your health care provider. Some medicines can make you confused or feel dizzy. This can increase  your chance of falling. Wear closed-toe shoes that fit well and support your feet. Wear shoes that have rubber soles and low heels. Use a cane, walker, scooter, or crutches that help you move around if needed. Talk with your provider about other ways that you can decrease your risk of falls. This may include seeing a physical therapist to learn to do exercises to improve movement and strength. Where to find more information Centers for Disease Control and Prevention, STEADI: TonerPromos.no [employer] on Aging: BaseRingTones.pl National Institute on Aging: BaseRingTones.pl Contact a health care provider if: You are afraid of falling at home. You feel weak, drowsy, or dizzy at home. You fall at home. Get help right away if you: Lose consciousness or have trouble moving after a fall. Have a fall that causes a head injury. These symptoms may be an emergency. Get help right away. Call 911. Do not wait to see if the symptoms will go away. Do not drive yourself to the hospital. This information is not intended to replace advice given to you by your health care provider. Make sure you discuss any questions you have with your health care provider. Document Revised: 06/03/2022 Document Reviewed: 06/03/2022 Elsevier Patient Education  2024 [employer].

## 2024-05-21 ENCOUNTER — Encounter: Payer: Medicaid Other | Admitting: Physician Assistant

## 2024-06-02 ENCOUNTER — Telehealth: Payer: Self-pay

## 2024-06-02 ENCOUNTER — Ambulatory Visit (INDEPENDENT_AMBULATORY_CARE_PROVIDER_SITE_OTHER): Admitting: Physician Assistant

## 2024-06-02 ENCOUNTER — Encounter: Payer: Self-pay | Admitting: Physician Assistant

## 2024-06-02 ENCOUNTER — Other Ambulatory Visit: Payer: Self-pay

## 2024-06-02 VITALS — BP 102/70 | HR 77 | Temp 97.9°F | Ht 71.0 in | Wt 153.0 lb

## 2024-06-02 DIAGNOSIS — Z1211 Encounter for screening for malignant neoplasm of colon: Secondary | ICD-10-CM

## 2024-06-02 DIAGNOSIS — Z125 Encounter for screening for malignant neoplasm of prostate: Secondary | ICD-10-CM

## 2024-06-02 DIAGNOSIS — N182 Chronic kidney disease, stage 2 (mild): Secondary | ICD-10-CM | POA: Diagnosis not present

## 2024-06-02 DIAGNOSIS — Z0000 Encounter for general adult medical examination without abnormal findings: Secondary | ICD-10-CM | POA: Diagnosis not present

## 2024-06-02 DIAGNOSIS — Z79899 Other long term (current) drug therapy: Secondary | ICD-10-CM

## 2024-06-02 DIAGNOSIS — D696 Thrombocytopenia, unspecified: Secondary | ICD-10-CM

## 2024-06-02 DIAGNOSIS — F819 Developmental disorder of scholastic skills, unspecified: Secondary | ICD-10-CM | POA: Insufficient documentation

## 2024-06-02 MED ORDER — NA SULFATE-K SULFATE-MG SULF 17.5-3.13-1.6 GM/177ML PO SOLN: 1.0000 | Freq: Once | ORAL | 0 refills | Status: AC
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
## 2024-06-02 NOTE — Telephone Encounter (Signed)
 Gastroenterology Pre-Procedure Review  Request Date: 09/27/24 Requesting Physician: Dr. Jinny  PATIENT REVIEW QUESTIONS: The patient's sister Randall Simon responded to the following health history questions as indicated:    1. Are you having any GI issues? no 2. Do you have a personal history of Polyps? no 3. Do you have a family history of Colon Cancer or Polyps? no 4. Diabetes Mellitus? no 5. Joint replacements in the past 12 months?no 6. Major health problems in the past 3 months?no 7. Any artificial heart valves, MVP, or defibrillator?no    MEDICATIONS & ALLERGIES:    Patient reports the following regarding taking any anticoagulation/antiplatelet therapy:   Plavix, Coumadin, Eliquis, Xarelto, Lovenox, Pradaxa, Brilinta, or Effient? no Aspirin? no  Patient confirms/reports the following medications:  Current Outpatient Medications  Medication Sig Dispense Refill   Na Sulfate-K Sulfate-Mg Sulfate concentrate (SUPREP) 17.5-3.13-1.6 GM/177ML SOLN Take 1 kit (354 mLs total) by mouth once for 1 dose. 354 mL 0   ARIPiprazole  (ABILIFY ) 15 MG tablet Take 1 tablet (15 mg total) by mouth daily. 90 tablet 3   ascorbic acid (VITAMIN C) 100 MG tablet Take by mouth.     Cholecalciferol (VITAMIN D-3 PO) Take 50 mcg by mouth. (Patient not taking: Reported on 06/02/2024)     famotidine  (PEPCID ) 20 MG tablet Take 1 tablet (20 mg total) by mouth daily. 90 tablet 1   vitamin B-12 (CYANOCOBALAMIN) 1000 MCG tablet Take by mouth.     No current facility-administered medications for this visit.    Patient confirms/reports the following allergies:  No Known Allergies  No orders of the defined types were placed in this encounter.   AUTHORIZATION INFORMATION Primary Insurance: 1D#: Group #:  Secondary Insurance: 1D#: Group #:  SCHEDULE INFORMATION: Date: 09/27/24 Time: Location: [facility]

## 2024-06-02 NOTE — Telephone Encounter (Signed)
 Email was added to pt file  [email or web address]  Tosha per pt please send information to email

## 2024-06-02 NOTE — Patient Instructions (Signed)

## 2024-06-03 LAB — CBC WITH DIFFERENTIAL/PLATELET
Basophils Absolute: 0 x10E3/uL (ref 0.0–0.2)
Basos: 1 %
EOS (ABSOLUTE): 0 x10E3/uL (ref 0.0–0.4)
Eos: 1 %
Hematocrit: 50.5 % (ref 37.5–51.0)
Hemoglobin: 16.5 g/dL (ref 13.0–17.7)
Immature Grans (Abs): 0 x10E3/uL (ref 0.0–0.1)
Immature Granulocytes: 0 %
Lymphocytes Absolute: 1 x10E3/uL (ref 0.7–3.1)
Lymphs: 29 %
MCH: 30.2 pg (ref 26.6–33.0)
MCHC: 32.7 g/dL (ref 31.5–35.7)
MCV: 93 fL (ref 79–97)
Monocytes Absolute: 0.3 x10E3/uL (ref 0.1–0.9)
Monocytes: 7 %
Neutrophils Absolute: 2.3 x10E3/uL (ref 1.4–7.0)
Neutrophils: 62 %
Platelets: 136 x10E3/uL — ABNORMAL LOW (ref 150–450)
RBC: 5.46 x10E6/uL (ref 4.14–5.80)
RDW: 12.4 % (ref 11.6–15.4)
WBC: 3.6 x10E3/uL (ref 3.4–10.8)

## 2024-06-03 LAB — LIPID PANEL
Chol/HDL Ratio: 4.7 ratio (ref 0.0–5.0)
Cholesterol, Total: 230 mg/dL — ABNORMAL HIGH (ref 100–199)
HDL: 49 mg/dL (ref >39)
LDL Chol Calc (NIH): 162 mg/dL — ABNORMAL HIGH (ref 0–99)
Triglycerides: 107 mg/dL (ref 0–149)
VLDL Cholesterol Cal: 19 mg/dL (ref 5–40)

## 2024-06-03 LAB — TSH: TSH: 2.88 u[IU]/mL (ref 0.450–4.500)

## 2024-06-03 LAB — COMPREHENSIVE METABOLIC PANEL WITH GFR
ALT: 22 IU/L (ref 0–44)
AST: 14 IU/L (ref 0–40)
Albumin: 5 g/dL — ABNORMAL HIGH (ref 3.9–4.9)
Alkaline Phosphatase: 57 IU/L (ref 44–121)
BUN/Creatinine Ratio: 14 (ref 10–24)
BUN: 18 mg/dL (ref 8–27)
Bilirubin Total: 0.4 mg/dL (ref 0.0–1.2)
CO2: 22 mmol/L (ref 20–29)
Calcium: 9.9 mg/dL (ref 8.6–10.2)
Chloride: 100 mmol/L (ref 96–106)
Creatinine, Ser: 1.3 mg/dL — ABNORMAL HIGH (ref 0.76–1.27)
Globulin, Total: 2.7 g/dL (ref 1.5–4.5)
Glucose: 90 mg/dL (ref 70–99)
Potassium: 4.4 mmol/L (ref 3.5–5.2)
Sodium: 139 mmol/L (ref 134–144)
Total Protein: 7.7 g/dL (ref 6.0–8.5)
eGFR: 62 mL/min/1.73 (ref >59)

## 2024-06-03 LAB — HEMOGLOBIN A1C
Est. average glucose Bld gHb Est-mCnc: 117 mg/dL
Hgb A1c MFr Bld: 5.7 % — ABNORMAL HIGH (ref 4.8–5.6)

## 2024-06-03 LAB — MICROALBUMIN / CREATININE URINE RATIO
Creatinine, Urine: 117.6 mg/dL
Microalb/Creat Ratio: 3 mg/g{creat} (ref 0–29)
Microalbumin, Urine: 3.8 ug/mL

## 2024-06-03 LAB — PSA: Prostate Specific Ag, Serum: 4.7 ng/mL — ABNORMAL HIGH (ref 0.0–4.0)

## 2024-06-04 ENCOUNTER — Encounter: Payer: Self-pay | Admitting: Physician Assistant

## 2024-06-04 ENCOUNTER — Ambulatory Visit: Payer: Self-pay | Admitting: Physician Assistant

## 2024-06-04 DIAGNOSIS — R972 Elevated prostate specific antigen [PSA]: Secondary | ICD-10-CM | POA: Insufficient documentation

## 2024-06-04 DIAGNOSIS — E782 Mixed hyperlipidemia: Secondary | ICD-10-CM | POA: Insufficient documentation

## 2024-07-20 ENCOUNTER — Other Ambulatory Visit: Payer: Self-pay

## 2024-07-20 ENCOUNTER — Encounter: Payer: Self-pay | Admitting: Psychiatry

## 2024-07-20 ENCOUNTER — Ambulatory Visit: Admitting: Psychiatry

## 2024-07-20 VITALS — BP 110/74 | HR 80 | Temp 97.5°F | Ht 71.0 in | Wt 153.6 lb

## 2024-07-20 DIAGNOSIS — F209 Schizophrenia, unspecified: Secondary | ICD-10-CM

## 2024-07-20 DIAGNOSIS — R4183 Borderline intellectual functioning: Secondary | ICD-10-CM | POA: Diagnosis not present
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
## 2024-08-18 ENCOUNTER — Other Ambulatory Visit: Payer: Self-pay | Admitting: Physician Assistant

## 2024-08-19 NOTE — Telephone Encounter (Signed)
 Requested Prescriptions  Pending Prescriptions Disp Refills   famotidine  (PEPCID ) 20 MG tablet [Pharmacy Med Name: FAMOTIDINE  20 MG TABLET] 90 tablet 0    Sig: TAKE 1 TABLET BY MOUTH EVERY DAY     Gastroenterology:  H2 Antagonists Passed - 08/19/2024  2:06 PM      Passed - Valid encounter within last 12 months    Recent Outpatient Visits           2 months ago Annual physical exam   [place] [facility] at [facility], Toribio SQUIBB, [place]   4 months ago Osteoarthritis of glenohumeral joint, left   [place] [facility] at [facility], Selinda PARAS, [place]   4 months ago Adhesive capsulitis of left shoulder   [facility] at [facility], Selinda PARAS, [place]   4 months ago Acute pain of left shoulder   [facility] at [facility], [place]   5 months ago Acute pain of left shoulder   [facility] at [facility], Toribio SQUIBB, [place]

## 2024-09-27 ENCOUNTER — Ambulatory Visit: Admission: RE | Admit: 2024-09-27 | Source: Home / Self Care | Admitting: Gastroenterology

## 2024-09-27 SURGERY — COLONOSCOPY
Anesthesia: General

## 2024-11-04 ENCOUNTER — Telehealth: Payer: Self-pay

## 2024-11-04 ENCOUNTER — Other Ambulatory Visit: Payer: Self-pay

## 2024-11-04 DIAGNOSIS — Z1211 Encounter for screening for malignant neoplasm of colon: Secondary | ICD-10-CM

## 2024-11-04 MED ORDER — PEG 3350-KCL-NA BICARB-NACL 420 G PO SOLR: ORAL | 0 refills | Status: AC

## 2024-11-04 NOTE — Telephone Encounter (Signed)
 Patient's sister-Randall Simon called to reschedule her brother's colonoscopy for 01/10/2025 at [facility] with Dr. Theophilus. [facility] will be informed of the scheduling. New information was sent to patient's MyChart account.

## 2024-11-08 ENCOUNTER — Other Ambulatory Visit: Payer: Self-pay | Admitting: Psychiatry

## 2024-11-08 DIAGNOSIS — F209 Schizophrenia, unspecified: Secondary | ICD-10-CM

## 2024-11-19 ENCOUNTER — Other Ambulatory Visit: Payer: Self-pay | Admitting: Physician Assistant

## 2024-11-19 NOTE — Telephone Encounter (Signed)
 Requested Prescriptions  Pending Prescriptions Disp Refills   famotidine  (PEPCID ) 20 MG tablet [Pharmacy Med Name: FAMOTIDINE  20 MG TABLET] 90 tablet 1    Sig: TAKE 1 TABLET BY MOUTH EVERY DAY     Gastroenterology:  H2 Antagonists Passed - 11/19/2024  4:37 PM      Passed - Valid encounter within last 12 months    Recent Outpatient Visits           5 months ago Annual physical exam   [place] [facility] Medicine at [facility], Toribio SQUIBB, [place]   7 months ago Osteoarthritis of glenohumeral joint, left   [place] [facility] at [facility], Selinda PARAS, [place]   7 months ago Adhesive capsulitis of left shoulder   [facility] at [facility], Selinda PARAS, [place]   7 months ago Acute pain of left shoulder   [facility] & [facility] Medicine at [facility], [place]   8 months ago Acute pain of left shoulder   [facility] at [facility], Toribio SQUIBB, [place]

## 2024-12-06 ENCOUNTER — Ambulatory Visit: Admitting: Physician Assistant

## 2025-01-10 ENCOUNTER — Ambulatory Visit: Admit: 2025-01-10

## 2025-01-10 SURGERY — COLONOSCOPY
Anesthesia: General

## 2025-01-18 ENCOUNTER — Ambulatory Visit: Admitting: Psychiatry

## 2025-05-11 ENCOUNTER — Ambulatory Visit

## 2025-06-06 ENCOUNTER — Encounter: Admitting: Physician Assistant
